# Patient Record
Sex: Female | Born: 1990 | Race: White | Hispanic: No | Marital: Single | Smoking: Former smoker
Health system: Southern US, Community
[De-identification: ages and names within clinical notes are randomized; demographics above are authoritative.]

## PROBLEM LIST (undated history)

## (undated) ENCOUNTER — Inpatient Hospital Stay: Payer: Self-pay

## (undated) DIAGNOSIS — D649 Anemia, unspecified: Secondary | ICD-10-CM

## (undated) HISTORY — PX: BACK SURGERY: SHX140

---
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
## 2009-06-05 ENCOUNTER — Emergency Department: Payer: Self-pay | Admitting: Unknown Physician Specialty

## 2009-06-26 ENCOUNTER — Emergency Department: Payer: Self-pay | Admitting: Emergency Medicine

## 2012-02-17 ENCOUNTER — Inpatient Hospital Stay: Payer: Self-pay | Admitting: Obstetrics and Gynecology

## 2012-02-17 LAB — PIH PROFILE
BUN: 8 mg/dL (ref 7–18)
Calcium, Total: 9 mg/dL (ref 8.5–10.1)
Co2: 25 mmol/L (ref 21–32)
EGFR (African American): >60
EGFR (Non-African Amer.): >60
Glucose: 84 mg/dL (ref 65–99)
MCV: 90 fL (ref 80–100)
Osmolality: 275 (ref 275–301)
Platelet: 263 10*3/uL (ref 150–440)
Potassium: 3.6 mmol/L (ref 3.5–5.1)
RBC: 3.41 10*6/uL — ABNORMAL LOW (ref 3.80–5.20)
RDW: 13.2 % (ref 11.5–14.5)
Sodium: 139 mmol/L (ref 136–145)
WBC: 12.5 10*3/uL — ABNORMAL HIGH (ref 3.6–11.0)

## 2012-02-17 LAB — PROTEIN / CREATININE RATIO, URINE
Creatinine, Urine: 122.8 mg/dL (ref 30.0–125.0)
Protein, Random Urine: 27 mg/dL — ABNORMAL HIGH (ref 0–12)
Protein/Creat. Ratio: 220 mg/gCREAT — ABNORMAL HIGH (ref 0–200)

## 2012-02-20 LAB — HEMATOCRIT: HCT: 28 % — ABNORMAL LOW (ref 35.0–47.0)

## 2012-02-26 ENCOUNTER — Emergency Department: Payer: Self-pay | Admitting: Internal Medicine

## 2013-12-14 ENCOUNTER — Emergency Department: Payer: Self-pay | Admitting: Emergency Medicine

## 2014-07-05 DIAGNOSIS — D649 Anemia, unspecified: Secondary | ICD-10-CM

## 2014-07-05 HISTORY — DX: Anemia, unspecified: D64.9

## 2014-11-12 NOTE — H&P (Signed)
L&D Evaluation:  History:   HPI 24yo G1 at 6717w4d [facility](EDC 02/13/12) referred from [facility] due to elevated BPs (130-140/90s) and 2+ proteinuria for r/o preE.  Pt denies any complaints, no HA, VC, SOB, edema.  +FM, no ctx, no VB. During L&D observation pt with 2min decel to 60s, with spontaneous return to nl baseline with position change.   AFI checked and found to be low at 4.0cm. PreE labs returned without significant abnormality.    Presents with other, r/o preE    Patient's Medical History Asthma  other  bipolar/anxiety    Patient's Surgical History other  L4-5 laminectomy    Medications Pre Natal Vitamins  prn albuterol    Allergies NKDA    Social History tobacco    Family History Non-Contributory   ROS:   ROS All systems were reviewed.  HEENT, CNS, GI, GU, Respiratory, CV, Renal and Musculoskeletal systems were found to be normal.   Exam:   Vital Signs stable  BPs wnl    Urine Protein trace    General no apparent distress    Mental Status clear    Abdomen gravid, non-tender    Estimated Fetal Weight Average for gestational age    Back no CVAT    Edema no edema    Pelvic cervix 1/80/-2    Mebranes Intact    FHT 140s mod variability, single decel to 60s x 2min, spontaneous return to baseline    Ucx absent    Other U/S as above - AFI 4cm, vertex presentation see PIH labs   Impression:   Impression other, oligohydramnios   Plan:   Plan monitor BP, Induction for oligohydramnios at term, pt amenable to treatment plan    Comments Cervidil for cervical ripening tonight, active labor management Blood type A NEG GBS neg   Electronic Signatures: Orvan FalconerStansbury Clipp, Charnelle Bergeman K (MD)  (Signed 15-Aug-13 17:00)  Authored: L&D Evaluation   Last Updated: 15-Aug-13 17:00 by Garnette GunnerStansbury Clipp, Ali LoweEryn K (MD)

## 2015-02-27 ENCOUNTER — Other Ambulatory Visit: Payer: Self-pay | Admitting: Advanced Practice Midwife

## 2015-02-27 DIAGNOSIS — Z369 Encounter for antenatal screening, unspecified: Secondary | ICD-10-CM

## 2015-02-27 LAB — OB RESULTS CONSOLE HIV ANTIBODY (ROUTINE TESTING): HIV: NONREACTIVE

## 2015-02-28 LAB — OB RESULTS CONSOLE GC/CHLAMYDIA
Chlamydia: NEGATIVE
GC PROBE AMP, GENITAL: NEGATIVE

## 2015-02-28 LAB — OB RESULTS CONSOLE RUBELLA ANTIBODY, IGM: RUBELLA: IMMUNE

## 2015-02-28 LAB — OB RESULTS CONSOLE ABO/RH: RH TYPE: NEGATIVE

## 2015-02-28 LAB — OB RESULTS CONSOLE HEPATITIS B SURFACE ANTIGEN: Hepatitis B Surface Ag: NEGATIVE

## 2015-02-28 LAB — OB RESULTS CONSOLE ANTIBODY SCREEN: Antibody Screen: NEGATIVE

## 2015-02-28 LAB — OB RESULTS CONSOLE RPR: RPR: NONREACTIVE

## 2015-02-28 LAB — OB RESULTS CONSOLE VARICELLA ZOSTER ANTIBODY, IGG: VARICELLA IGG: IMMUNE

## 2015-03-20 ENCOUNTER — Ambulatory Visit
Admission: RE | Admit: 2015-03-20 | Discharge: 2015-03-20 | Disposition: A | Discharge status: Home / Self Care | Payer: Medicaid Other | Source: Ambulatory Visit | Attending: Advanced Practice Midwife | Admitting: Advanced Practice Midwife

## 2015-03-20 ENCOUNTER — Ambulatory Visit (HOSPITAL_BASED_OUTPATIENT_CLINIC_OR_DEPARTMENT_OTHER)
Admission: RE | Admit: 2015-03-20 | Discharge: 2015-03-20 | Disposition: A | Discharge status: Home / Self Care | Payer: Medicaid Other | Source: Ambulatory Visit | Attending: Obstetrics & Gynecology | Admitting: Obstetrics & Gynecology

## 2015-03-20 DIAGNOSIS — Z369 Encounter for antenatal screening, unspecified: Secondary | ICD-10-CM

## 2015-03-20 DIAGNOSIS — F1290 Cannabis use, unspecified, uncomplicated: Secondary | ICD-10-CM | POA: Insufficient documentation

## 2015-03-20 DIAGNOSIS — Z36 Encounter for antenatal screening of mother: Secondary | ICD-10-CM | POA: Diagnosis not present

## 2015-03-20 DIAGNOSIS — O99322 Drug use complicating pregnancy, second trimester: Secondary | ICD-10-CM | POA: Insufficient documentation

## 2015-03-20 DIAGNOSIS — Z3402 Encounter for supervision of normal first pregnancy, second trimester: Secondary | ICD-10-CM

## 2015-03-20 DIAGNOSIS — Z3A14 14 weeks gestation of pregnancy: Secondary | ICD-10-CM | POA: Diagnosis not present

## 2015-03-20 DIAGNOSIS — O99332 Smoking (tobacco) complicating pregnancy, second trimester: Secondary | ICD-10-CM | POA: Insufficient documentation

## 2015-03-20 NOTE — Progress Notes (Addendum)
Referring physician:  [facility] Department Length of Consultation: 30 minutes   Ms. Katie Walton  was referred to [facility] for genetic counseling to review prenatal screening and testing options.  This note summarizes the information we discussed.    The following screening test options were reviewed: First trimester screening, which includes nuchal translucency ultrasound screen and first trimester maternal serum marker screening.  The nuchal translucency has approximately an 80% detection rate for Down syndrome and can be positive for other chromosome abnormalities as well as congenital heart defects.  When combined with a maternal serum marker screening, the detection rate is up to 90% for Down syndrome and up to 97% for trisomy 18.     Maternal serum marker screening, a blood test that measures pregnancy proteins, can provide risk assessments for Down syndrome, trisomy 18, and open neural tube defects (spina bifida, anencephaly). Because it does not directly examine the fetus, it cannot positively diagnose or rule out these problems.  Targeted ultrasound uses high frequency sound waves to create an image of the developing fetus.  An ultrasound is often recommended as a routine means of evaluating the pregnancy.  It is also used to screen for fetal anatomy problems (for example, a heart defect) that might be suggestive of a chromosomal or other abnormality.   Should these screening tests indicate an increased concern, then the following additional testing options would be offered:  The chorionic villus sampling procedure is available for first trimester chromosome analysis.  This involves the withdrawal of a small amount of chorionic villi (tissue from the developing placenta).  Risk of pregnancy loss is estimated to be approximately 1 in 200 to 1 in 100 (0.5 to 1%).  There is approximately a 1% (1 in 100) chance that the CVS chromosome results will be unclear.   Chorionic villi cannot be tested for neural tube defects.     Amniocentesis involves the removal of a small amount of amniotic fluid from the sac surrounding the fetus with the use of a thin needle inserted through the maternal abdomen and uterus.  Ultrasound guidance is used throughout the procedure.  Fetal cells from amniotic fluid are directly evaluated and > 99.5% of chromosome problems and > 98% of open neural tube defects can be detected. This procedure is generally performed after the 15th week of pregnancy.  The main risks to this procedure include complications leading to miscarriage in less than 1 in 200 cases (0.5%).  As another option for information if the pregnancy is suspected to be an an increased chance for certain chromosome conditions, we also reviewed the availability of cell free fetal DNA testing from maternal blood to determine whether or not the baby may have either Down syndrome, trisomy 63, or trisomy 55.  This test utilizes a maternal blood sample and DNA sequencing technology to isolate circulating cell free fetal DNA from maternal plasma.  The fetal DNA can then be analyzed for DNA sequences that are derived from the three most common chromosomes involved in aneuploidy, chromosomes 13, 18, and 21.  If the overall amount of DNA is greater than the expected level for any of these chromosomes, aneuploidy is suspected.  While we do not consider it a replacement for invasive testing and karyotype analysis, a negative result from this testing would be reassuring, though not a guarantee of a normal chromosome complement for the baby.  An abnormal result is certainly suggestive of an abnormal chromosome complement, though we would still  recommend CVS or amniocentesis to confirm any findings from this testing.  Cystic Fibrosis screening was also discussed with the patient. Cystic fibrosis (CF) is one of the most common genetic conditions in persons of Caucasian ancestry.  This condition  occurs in approximately 1 in 2,500 Caucasian persons and results in thickened secretions in the lungs, digestive, and reproductive systems.  For a baby to be at risk for having CF, both of the parents must be carriers for this condition.  Approximately 1 in 50 Caucasian persons is a carrier for CF.  Current carrier testing looks for the most common mutations in the gene for CF and can detect approximately 90% of carriers in the Caucasian population.  This means that the carrier screening can greatly reduce, but cannot eliminate, the chance for an individual to have a child with CF.  If an individual is found to be a carrier for CF, then carrier testing would be available for the partner. As part of Katie Walton, all babies born in the state of [place] will have a two-tier screening process.  Specimens are first tested to determine the concentration of immunoreactive trypsinogen (IRT).  The top 5% of specimens with the highest IRT values then undergo DNA testing using a panel of over 40 common CF mutations.   We obtained a detailed family history and pregnancy history.  In the family, the father of the baby reported that his maternal half brother and his partner had a stillborn baby and one with a heart condition requiring a pacemaker.  No specific details for these children were known.  We discussed that in order to provide an assessment, more medical information would be needed. We are happy to review medical records if they become available.  The remainder of the family history was reported to be unremarkable for birth defects, mental retardation, recurrent pregnancy loss or known chromosome abnormalities.  Katie Walton stated that this is her second pregnancy, the first with her current partner, Katie Walton.  She has a three year old son who is in good health.  She was induced in that pregnancy due to preeclampsia and was advised by her OB to begin taking baby aspirin starting at  [redacted] weeks gestation.  She reported smoking cigarettes and marijuana in this pregnancy.  We reviewed that both of these exposures are known to be associated with an increase in low birth weight, preterm delivery and poor pregnancy outcomes and therefore we encouraged her to stop for the remainder of the pregnancy.  After consideration of the options, Katie Walton elected to proceed with first trimester screening and declined CF carrier testing.  Following the ultrasound, the gestational age was too far along for this testing.  She was therefore told to speak with her OB about having maternal serum screening at 16-[redacted] weeks gestation.  The patient was scheduled to return in 3 weeks for an anatomy ultrasound.  An ultrasound was performed at the time of the visit.  The gestational age was consistent with  14 weeks, 6 days.  Fetal anatomy could not be assessed due to early gestational age.  Please refer to the ultrasound report for details of that study.  Katie Walton was encouraged to call with questions or concerns.  We can be contacted at [phone].   Cherly Anderson, MS, CGC  I was present for this consultation  Oreta Soloway, [place] A, MD

## 2015-04-09 ENCOUNTER — Other Ambulatory Visit: Payer: Self-pay

## 2015-04-09 DIAGNOSIS — O359XX0 Maternal care for (suspected) fetal abnormality and damage, unspecified, not applicable or unspecified: Secondary | ICD-10-CM

## 2015-04-10 ENCOUNTER — Ambulatory Visit
Admission: RE | Admit: 2015-04-10 | Discharge: 2015-04-10 | Disposition: A | Discharge status: Home / Self Care | Payer: No Typology Code available for payment source | Source: Ambulatory Visit | Attending: Maternal & Fetal Medicine | Admitting: Maternal & Fetal Medicine

## 2015-04-10 DIAGNOSIS — O359XX0 Maternal care for (suspected) fetal abnormality and damage, unspecified, not applicable or unspecified: Secondary | ICD-10-CM

## 2015-04-10 DIAGNOSIS — Z3A17 17 weeks gestation of pregnancy: Secondary | ICD-10-CM | POA: Diagnosis not present

## 2015-04-10 DIAGNOSIS — O99332 Smoking (tobacco) complicating pregnancy, second trimester: Secondary | ICD-10-CM | POA: Insufficient documentation

## 2015-04-10 MED ORDER — GLUCOSE BLOOD VI STRP: ORAL_STRIP | Status: DC

## 2015-04-10 MED ORDER — ONETOUCH ULTRASOFT LANCETS MISC: Status: DC

## 2015-04-24 ENCOUNTER — Ambulatory Visit
Admission: RE | Admit: 2015-04-24 | Discharge: 2015-04-24 | Disposition: A | Discharge status: Home / Self Care | Payer: No Typology Code available for payment source | Source: Ambulatory Visit | Attending: Maternal & Fetal Medicine | Admitting: Maternal & Fetal Medicine

## 2015-04-24 DIAGNOSIS — O359XX0 Maternal care for (suspected) fetal abnormality and damage, unspecified, not applicable or unspecified: Secondary | ICD-10-CM | POA: Diagnosis present

## 2015-04-24 DIAGNOSIS — Z3A19 19 weeks gestation of pregnancy: Secondary | ICD-10-CM | POA: Insufficient documentation

## 2015-06-08 ENCOUNTER — Observation Stay
Admission: EM | Admit: 2015-06-08 | Discharge: 2015-06-08 | Disposition: A | Discharge status: Home / Self Care | Payer: No Typology Code available for payment source | Attending: Obstetrics & Gynecology | Admitting: Obstetrics & Gynecology

## 2015-06-08 ENCOUNTER — Encounter: Payer: Self-pay | Admitting: *Deleted

## 2015-06-08 DIAGNOSIS — R1013 Epigastric pain: Secondary | ICD-10-CM | POA: Insufficient documentation

## 2015-06-08 DIAGNOSIS — O26892 Other specified pregnancy related conditions, second trimester: Principal | ICD-10-CM | POA: Insufficient documentation

## 2015-06-08 DIAGNOSIS — Z3A26 26 weeks gestation of pregnancy: Secondary | ICD-10-CM | POA: Diagnosis not present

## 2015-06-08 DIAGNOSIS — R109 Unspecified abdominal pain: Secondary | ICD-10-CM | POA: Diagnosis present

## 2015-06-08 MED ORDER — ACETAMINOPHEN 325 MG PO TABS: 650.0000 mg | ORAL_TABLET | ORAL | Status: DC | PRN

## 2015-06-08 NOTE — Discharge Summary (Signed)
Valora PiccoloHannah H Walton is a 24 y.o. female. She is at 9823w2d gestation.  Indication: abdominal/epigastric pain  S: Resting comfortably. no CTX, no VB. Active fetal movement. Concerned about epigastric pain and abdominal pain.  Upon arrival to triage these pains have subsided.  No complaints at this time.  O:  Temp(Src) 98.4 F (36.9 C) (Oral)  Resp 16  Ht 5\' 4"  (1.626 m)  Wt 72.576 kg (160 lb)  BMI 27.45 kg/m2  LMP 12/22/2014    Gen: NAD, AAOx3      Abd: FNTTP      Ext: Non-tender, Nonedmeatous    FHT: 155 moderate variability. TOCO: quiet SVE: deferred   A/P:  24yo @ 26.2 with previous history of epigastric/abdominal pain requesting to be discharged.   Labor: not present.   Pain resolved.   Fetal Wellbeing: FHT baseline WNL.  D/c home stable, precautions reviewed, follow-up as scheduled.   Ward, Elenora Fenderhelsea C

## 2015-06-08 NOTE — OB Triage Note (Signed)
Abdominal pain since 1400. Mid to lower abdominal pain per patient. No pain as of now. When she has the pain it is a constant stabbing pain. Elaina HoopsElks, Eligio Angert S

## 2015-06-19 LAB — OB RESULTS CONSOLE HIV ANTIBODY (ROUTINE TESTING): HIV: NONREACTIVE

## 2015-06-20 LAB — OB RESULTS CONSOLE RPR: RPR: NONREACTIVE

## 2015-07-03 ENCOUNTER — Ambulatory Visit
Admission: RE | Admit: 2015-07-03 | Discharge: 2015-07-03 | Disposition: A | Discharge status: Home / Self Care | Payer: Managed Care, Other (non HMO) | Source: Ambulatory Visit | Attending: Obstetrics and Gynecology | Admitting: Obstetrics and Gynecology

## 2015-07-03 DIAGNOSIS — O359XX0 Maternal care for (suspected) fetal abnormality and damage, unspecified, not applicable or unspecified: Secondary | ICD-10-CM

## 2015-07-03 HISTORY — DX: Anemia, unspecified: D64.9

## 2015-07-06 NOTE — L&D Delivery Note (Signed)
Deliver Note   Date of Delivery:   09/14/2015 Primary OB:   [facility] Gestational Age/EDD: 281w2d by 09/12/2015, by Other Basis  Antepartum complications:  OB History    Gravida Para Term Preterm AB TAB SAB Ectopic Multiple Living   2 1 1  0 0 0 0 0 0 1      Delivered By:   [name], [name] MD  Delivery Type:   TSVD Anesthesia:     none  Intrapartum complications:  GBS:    Negative (02/17 0000) Laceration:     none Episiotomy:    none Placenta:    Spontaneous Estimated Blood Loss:   250mL Baby:     Liveborn female  APGAR (1 MIN): 8  APGAR (5 MINS): 9  Weight  pending   Deliver Details   At 22:41 a liveborn female was delivered via TSVD (Presentation: OA).  APGAR: 8, 9; weight  pending.   Placenta status: spontaneous, intact.  Cord: 3VC with the following complications: meconium, THC positive urine drug screen.    Mom to postpartum.  Baby to Couplet care / Skin to Skin.

## 2015-08-21 LAB — OB RESULTS CONSOLE GC/CHLAMYDIA
Chlamydia: NEGATIVE
GC PROBE AMP, GENITAL: NEGATIVE

## 2015-08-22 LAB — OB RESULTS CONSOLE GBS: STREP GROUP B AG: NEGATIVE

## 2015-09-14 ENCOUNTER — Inpatient Hospital Stay
Admission: EM | Admit: 2015-09-14 | Discharge: 2015-09-16 | DRG: 775 | Disposition: A | Discharge status: Home / Self Care | Payer: Medicaid Other | Attending: Obstetrics and Gynecology | Admitting: Obstetrics and Gynecology

## 2015-09-14 DIAGNOSIS — O99324 Drug use complicating childbirth: Secondary | ICD-10-CM | POA: Diagnosis present

## 2015-09-14 DIAGNOSIS — Z23 Encounter for immunization: Secondary | ICD-10-CM

## 2015-09-14 DIAGNOSIS — F17200 Nicotine dependence, unspecified, uncomplicated: Secondary | ICD-10-CM | POA: Diagnosis present

## 2015-09-14 DIAGNOSIS — F319 Bipolar disorder, unspecified: Secondary | ICD-10-CM | POA: Diagnosis present

## 2015-09-14 DIAGNOSIS — Z3A40 40 weeks gestation of pregnancy: Secondary | ICD-10-CM

## 2015-09-14 DIAGNOSIS — F1290 Cannabis use, unspecified, uncomplicated: Secondary | ICD-10-CM | POA: Diagnosis present

## 2015-09-14 DIAGNOSIS — O99334 Smoking (tobacco) complicating childbirth: Secondary | ICD-10-CM | POA: Diagnosis present

## 2015-09-14 LAB — PROTEIN / CREATININE RATIO, URINE
Creatinine, Urine: 235 mg/dL
PROTEIN CREATININE RATIO: 0.26 mg/mg{creat} — AB (ref 0.00–0.15)
Total Protein, Urine: 62 mg/dL

## 2015-09-14 LAB — CBC
HEMATOCRIT: 33.6 % — AB (ref 35.0–47.0)
Hemoglobin: 11.1 g/dL — ABNORMAL LOW (ref 12.0–16.0)
MCH: 30.3 pg (ref 26.0–34.0)
MCHC: 33 g/dL (ref 32.0–36.0)
MCV: 91.8 fL (ref 80.0–100.0)
Platelets: 325 10*3/uL (ref 150–440)
RBC: 3.66 MIL/uL — AB (ref 3.80–5.20)
RDW: 13.2 % (ref 11.5–14.5)
WBC: 20.7 10*3/uL — AB (ref 3.6–11.0)

## 2015-09-14 LAB — COMPREHENSIVE METABOLIC PANEL
ALBUMIN: 3.2 g/dL — AB (ref 3.5–5.0)
ALK PHOS: 138 U/L — AB (ref 38–126)
ALT: 10 U/L — AB (ref 14–54)
ANION GAP: 8 (ref 5–15)
AST: 19 U/L (ref 15–41)
BUN: 9 mg/dL (ref 6–20)
CALCIUM: 8.4 mg/dL — AB (ref 8.9–10.3)
CO2: 20 mmol/L — AB (ref 22–32)
Chloride: 105 mmol/L (ref 101–111)
Creatinine, Ser: 0.36 mg/dL — ABNORMAL LOW (ref 0.44–1.00)
GFR calc Af Amer: >60 mL/min (ref >60)
GFR calc non Af Amer: >60 mL/min (ref >60)
GLUCOSE: 91 mg/dL (ref 65–99)
Potassium: 3.7 mmol/L (ref 3.5–5.1)
SODIUM: 133 mmol/L — AB (ref 135–145)
Total Bilirubin: 0.5 mg/dL (ref 0.3–1.2)
Total Protein: 7.1 g/dL (ref 6.5–8.1)

## 2015-09-14 LAB — URINE DRUG SCREEN, QUALITATIVE (ARMC ONLY)
AMPHETAMINES, UR SCREEN: NOT DETECTED
Barbiturates, Ur Screen: NOT DETECTED
Benzodiazepine, Ur Scrn: NOT DETECTED
Cannabinoid 50 Ng, Ur ~~LOC~~: POSITIVE — AB
Cocaine Metabolite,Ur ~~LOC~~: NOT DETECTED
MDMA (ECSTASY) UR SCREEN: NOT DETECTED
Methadone Scn, Ur: NOT DETECTED
Opiate, Ur Screen: NOT DETECTED
PHENCYCLIDINE (PCP) UR S: NOT DETECTED
Tricyclic, Ur Screen: NOT DETECTED

## 2015-09-14 LAB — ABO/RH: ABO/RH(D): A NEG

## 2015-09-14 MED ORDER — CITRIC ACID-SODIUM CITRATE 334-500 MG/5ML PO SOLN: 30.0000 mL | ORAL | Status: DC | PRN

## 2015-09-14 MED ORDER — OXYTOCIN 40 UNITS IN LACTATED RINGERS INFUSION - SIMPLE MED
2.5000 [IU]/h | INTRAVENOUS | Status: DC
  Administered 2015-09-14: 40 [IU] via INTRAVENOUS

## 2015-09-14 MED ORDER — LACTATED RINGERS IV SOLN: INTRAVENOUS | Status: DC

## 2015-09-14 MED ORDER — LIDOCAINE HCL (PF) 1 % IJ SOLN
INTRAMUSCULAR | Status: AC
  Filled 2015-09-14: qty 30

## 2015-09-14 MED ORDER — ACETAMINOPHEN 325 MG PO TABS: 650.0000 mg | ORAL_TABLET | ORAL | Status: DC | PRN

## 2015-09-14 MED ORDER — OXYTOCIN BOLUS FROM INFUSION: 500.0000 mL | INTRAVENOUS | Status: DC

## 2015-09-14 MED ORDER — ONDANSETRON HCL 4 MG/2ML IJ SOLN: 4.0000 mg | Freq: Four times a day (QID) | INTRAMUSCULAR | Status: DC | PRN

## 2015-09-14 MED ORDER — OXYTOCIN 10 UNIT/ML IJ SOLN
INTRAMUSCULAR | Status: AC
  Filled 2015-09-14: qty 2

## 2015-09-14 MED ORDER — AMMONIA AROMATIC IN INHA
RESPIRATORY_TRACT | Status: AC
  Filled 2015-09-14: qty 10

## 2015-09-14 MED ORDER — MISOPROSTOL 200 MCG PO TABS
ORAL_TABLET | ORAL | Status: AC
  Filled 2015-09-14: qty 4

## 2015-09-14 MED ORDER — BUTORPHANOL TARTRATE 1 MG/ML IJ SOLN: 1.0000 mg | INTRAMUSCULAR | Status: DC | PRN

## 2015-09-14 MED ORDER — LACTATED RINGERS IV SOLN: 500.0000 mL | INTRAVENOUS | Status: DC | PRN

## 2015-09-14 MED ORDER — LIDOCAINE HCL (PF) 1 % IJ SOLN: 30.0000 mL | INTRAMUSCULAR | Status: DC | PRN

## 2015-09-14 MED ORDER — OXYTOCIN 40 UNITS IN LACTATED RINGERS INFUSION - SIMPLE MED
INTRAVENOUS | Status: AC
  Administered 2015-09-14: 40 [IU] via INTRAVENOUS
  Filled 2015-09-14: qty 1000

## 2015-09-14 NOTE — Discharge Summary (Signed)
Obstetric Discharge Summary Reason for Admission: onset of labor 09/14/2015 Prenatal Procedures: none Intrapartum Procedures: spontaneous vaginal delivery 09/14/2015 Postpartum Procedures: none Complications-Operative and Postpartum: none HEMOGLOBIN  Date Value Ref Range Status  09/15/2015 11.2* 12.0 - 16.0 g/dL Final   HGB  Date Value Ref Range Status  02/17/2012 10.2* 12.0-16.0 g/dL Final   HCT  Date Value Ref Range Status  09/15/2015 33.0* 35.0 - 47.0 % Final  02/20/2012 28.0* 35.0-47.0 % Final    Physical Exam:  General: alert, appears stated age and no distress Lochia: appropriate Uterine Fundus: firm at U/ML/NT DVT Evaluation: No evidence of DVT seen on physical exam.  Discharge Diagnoses: Term Pregnancy-delivered  Marijuana use in pregnancy  Discharge Information: Date: 09/16/2015 Activity: pelvic rest x 6 weeks Diet: routine   Medication List    STOP taking these medications        ferrous sulfate 325 (65 FE) MG tablet      TAKE these medications        acetaminophen 500 MG tablet  Commonly known as:  TYLENOL  Take 500 mg by mouth every 6 (six) hours as needed for mild pain.     albuterol 108 (90 Base) MCG/ACT inhaler  Commonly known as:  PROVENTIL HFA;VENTOLIN HFA  Inhale 2 puffs into the lungs every 4 (four) hours as needed for wheezing or shortness of breath.     ibuprofen 600 MG tablet  Commonly known as:  ADVIL,MOTRIN  Take 1 tablet (600 mg total) by mouth every 6 (six) hours as needed for mild pain, moderate pain or cramping.     prenatal multivitamin Tabs tablet  Take 1 tablet by mouth daily at 12 noon.        Condition: stable Discharge to: home Follow-up Information    [facility] Call in 1 day.   Why:  for 6 week postpartum appt   Contact information:   [address] [place] [id number] [phone]       Newborn Data: Live born unspecified sex  Birth Weight: 6 lb 3  oz (2807 g)/ Oliver/ Breast APGAR: 8,9  Home with mother.  GUTIERREZ, COLLEEN 09/16/2015, 9:10 AM   *Edited for correction of delivery date. Thomasene MohairStephen Devarion Mcclanahan, MD 09/16/2015 9:51 AM

## 2015-09-14 NOTE — Progress Notes (Signed)
Subjective:  No concerns, tolerating contractions well  Objective:   Vitals: Blood pressure 129/88, pulse 88, temperature 98.4 F (36.9 C), temperature source Oral, resp. rate 18, height 5\' 3"  (1.6 m), weight 80.74 kg (178 lb), last menstrual period 12/22/2014. General:  Abdomen: Cervical Exam: 7.5/C/-2, arom meconium  FHT: 150, moderate variability, +accels, two decels 18:24 and 20:36 Toco: q2-674min  Results for orders placed or performed during the hospital encounter of 09/14/15 (from the past 24 hour(s))  Protein / creatinine ratio, urine     Status: Abnormal   Collection Time: 09/14/15  6:54 PM  Result Value Ref Range   Creatinine, Urine 235 mg/dL   Total Protein, Urine 62 mg/dL   Protein Creatinine Ratio 0.26 (H) 0.00 - 0.15 mg/mg[Cre]  Urine Drug Screen, Qualitative ([facility] only)     Status: Abnormal   Collection Time: 09/14/15  6:54 PM  Result Value Ref Range   Tricyclic, Ur Screen NONE DETECTED NONE DETECTED   Amphetamines, Ur Screen NONE DETECTED NONE DETECTED   MDMA (Ecstasy)Ur Screen NONE DETECTED NONE DETECTED   Cocaine Metabolite,Ur [place] NONE DETECTED NONE DETECTED   Opiate, Ur Screen NONE DETECTED NONE DETECTED   Phencyclidine (PCP) Ur S NONE DETECTED NONE DETECTED   Cannabinoid 50 Ng, Ur [place] POSITIVE (A) NONE DETECTED   Barbiturates, Ur Screen NONE DETECTED NONE DETECTED   Benzodiazepine, Ur Scrn NONE DETECTED NONE DETECTED   Methadone Scn, Ur NONE DETECTED NONE DETECTED  Type and screen [facility]     Status: None   Collection Time: 09/14/15  6:55 PM  Result Value Ref Range   ABO/RH(D) A NEG    Antibody Screen NEG    Sample Expiration 09/17/2015   Comprehensive metabolic panel     Status: Abnormal   Collection Time: 09/14/15  6:57 PM  Result Value Ref Range   Sodium 133 (L) 135 - 145 mmol/L   Potassium 3.7 3.5 - 5.1 mmol/L   Chloride 105 101 - 111 mmol/L   CO2 20 (L) 22 - 32 mmol/L   Glucose, Bld 91 65 - 99 mg/dL   BUN 9 6 - 20 mg/dL   Creatinine, Ser 0.980.36 (L) 0.44 - 1.00 mg/dL   Calcium 8.4 (L) 8.9 - 10.3 mg/dL   Total Protein 7.1 6.5 - 8.1 g/dL   Albumin 3.2 (L) 3.5 - 5.0 g/dL   AST 19 15 - 41 U/L   ALT 10 (L) 14 - 54 U/L   Alkaline Phosphatase 138 (H) 38 - 126 U/L   Total Bilirubin 0.5 0.3 - 1.2 mg/dL   GFR calc non Af Amer >60 >60 mL/min   GFR calc Af Amer >60 >60 mL/min   Anion gap 8 5 - 15  CBC     Status: Abnormal   Collection Time: 09/14/15  6:57 PM  Result Value Ref Range   WBC 20.7 (H) 3.6 - 11.0 K/uL   RBC 3.66 (L) 3.80 - 5.20 MIL/uL   Hemoglobin 11.1 (L) 12.0 - 16.0 g/dL   HCT 11.933.6 (L) 14.735.0 - 82.947.0 %   MCV 91.8 80.0 - 100.0 fL   MCH 30.3 26.0 - 34.0 pg   MCHC 33.0 32.0 - 36.0 g/dL   RDW 56.213.2 13.011.5 - 86.514.5 %   Platelets 325 150 - 440 K/uL    Assessment:   25 y.o. G2P1001 5940w2d term labor  Plan:   1) Labor - arom performed with meconium stained fluid noted, external monitors  2) Fetus - cat II tracing,  overall category I with exception of 2 decels noted above

## 2015-09-14 NOTE — H&P (Signed)
Obstetric H&P   Chief Complaint: Contractions  Prenatal Care Provider: [facility]  History of Present Illness: 25 y.o. G2P1001 9w2dby 09/12/2015 by 10 week [place] presenting with contractions since 0400 this morning.  No LOF, no VB, +FM.  PNC notable for THC+ UDS, history of bipolar currently unmedicated but previously on lamictal, history of self harming behavior, history of asthma.  History of preclampsia with prior birth 2013.  Smoker  A negative / RI by history MMR x 2 / VZI / HBsAg neg / RPR NR  / HIV neg / Quad screen negative / Glucose 123 / GBS negative 08/22/15  TDAP 06/19/15, declined influenza vaccination   Breast feeding/Paraguard  Review of Systems: 10 point review of systems negative unless otherwise noted in HPI  Past Medical History: Past Medical History  Diagnosis Date  . Pregnancy induced hypertension 2013  . Anemia 2016  . Anxiety     h/o anxiety  . Depression     h/o bipolar    Past Surgical History: Past Surgical History  Procedure Laterality Date  . Back surgery  25years old   Family History: History reviewed. No pertinent family history.  Social History: Social History   Social History  . Marital Status: Single    Spouse Name: N/A  . Number of Children: N/A  . Years of Education: N/A   Occupational History  . Not on file.   Social History Main Topics  . Smoking status: Current Every Day Smoker -- 0.50 packs/day  . Smokeless tobacco: Never Used  . Alcohol Use: No  . Drug Use: No  . Sexual Activity: Yes   Other Topics Concern  . Not on file   Social History Narrative    Medications: Prior to Admission medications   Medication Sig Start Date End Date Taking? Authorizing Provider  acetaminophen (TYLENOL) 500 MG tablet Take 500 mg by mouth every 6 (six) hours as needed for mild pain.   Yes Historical Provider, MD  albuterol (PROVENTIL HFA;VENTOLIN HFA) 108 (90 BASE) MCG/ACT inhaler Inhale 2 puffs into the lungs every 4 (four) hours as needed  for wheezing or shortness of breath.   Yes Historical Provider, MD  ferrous sulfate 325 (65 FE) MG tablet Take 325 mg by mouth daily with breakfast.   Yes Historical Provider, MD  Prenatal Vit-Fe Fumarate-FA (PRENATAL MULTIVITAMIN) TABS tablet Take 1 tablet by mouth daily at 12 noon.   Yes Historical Provider, MD    Allergies: No Known Allergies  Physical Exam: Vitals: Blood pressure 134/81, pulse 90, temperature 98.4 F (36.9 C), temperature source Oral, resp. rate 18, height 5' 3"  (1.6 m), weight 80.74 kg (178 lb), last menstrual period 12/22/2014.  Urine Dip Protein: P/C ratio pending  FHT: 150, moderate, +accels, one deceleration unrelated to contraction  Toco: q2-466m  General: NAD HEENT: normocephalic, anicteric Pulmonary: no increased work of breathing Abdomen: Gravid,  Non-tender Leopolds: vtx Genitourinary: 7cm per nursing staff Extremities: no edema  Labs: No results found for this or any previous visit (from the past 24 hour(s)).  Assessment: [age].o. G2P1001 4025w2d 09/12/2015, presenting in term labor  Plan: 1) Labor - expectant management  2) Fetus - cat I tracing  3) A negative / RI by history MMR x 2 / VZI / HBsAg neg / RPR NR  / HIV neg / Quad screen negative / Glucose 123 / GBS negative 08/22/15  4) TDAP - received  5) Disposition - pending delivery

## 2015-09-15 ENCOUNTER — Encounter: Payer: Self-pay | Admitting: *Deleted

## 2015-09-15 LAB — CBC
HEMATOCRIT: 33 % — AB (ref 35.0–47.0)
Hemoglobin: 11.2 g/dL — ABNORMAL LOW (ref 12.0–16.0)
MCH: 30.5 pg (ref 26.0–34.0)
MCHC: 33.8 g/dL (ref 32.0–36.0)
MCV: 90.2 fL (ref 80.0–100.0)
Platelets: 301 10*3/uL (ref 150–440)
RBC: 3.66 MIL/uL — ABNORMAL LOW (ref 3.80–5.20)
RDW: 12.9 % (ref 11.5–14.5)
WBC: 24.6 10*3/uL — ABNORMAL HIGH (ref 3.6–11.0)

## 2015-09-15 LAB — FETAL SCREEN: FETAL SCREEN: NEGATIVE

## 2015-09-15 LAB — TYPE AND SCREEN
ABO/RH(D): A NEG
Antibody Screen: NEGATIVE

## 2015-09-15 MED ORDER — LANOLIN HYDROUS EX OINT: TOPICAL_OINTMENT | CUTANEOUS | Status: DC | PRN

## 2015-09-15 MED ORDER — SENNOSIDES-DOCUSATE SODIUM 8.6-50 MG PO TABS
2.0000 | ORAL_TABLET | ORAL | Status: DC
  Administered 2015-09-15: 2 via ORAL
  Filled 2015-09-15: qty 2

## 2015-09-15 MED ORDER — OXYCODONE-ACETAMINOPHEN 5-325 MG PO TABS: 1.0000 | ORAL_TABLET | ORAL | Status: DC | PRN

## 2015-09-15 MED ORDER — DIBUCAINE 1 % RE OINT: 1.0000 "application " | TOPICAL_OINTMENT | RECTAL | Status: DC | PRN

## 2015-09-15 MED ORDER — ACETAMINOPHEN 325 MG PO TABS: 650.0000 mg | ORAL_TABLET | ORAL | Status: DC | PRN

## 2015-09-15 MED ORDER — ONDANSETRON HCL 4 MG PO TABS: 4.0000 mg | ORAL_TABLET | ORAL | Status: DC | PRN

## 2015-09-15 MED ORDER — PRENATAL MULTIVITAMIN CH
1.0000 | ORAL_TABLET | Freq: Every day | ORAL | Status: DC
  Administered 2015-09-15 – 2015-09-16 (×2): 1 via ORAL
  Filled 2015-09-15 (×2): qty 1

## 2015-09-15 MED ORDER — OXYCODONE-ACETAMINOPHEN 5-325 MG PO TABS
2.0000 | ORAL_TABLET | ORAL | Status: DC | PRN
  Administered 2015-09-15: 2 via ORAL
  Filled 2015-09-15: qty 2

## 2015-09-15 MED ORDER — SIMETHICONE 80 MG PO CHEW: 80.0000 mg | CHEWABLE_TABLET | ORAL | Status: DC | PRN

## 2015-09-15 MED ORDER — DIPHENHYDRAMINE HCL 25 MG PO CAPS: 25.0000 mg | ORAL_CAPSULE | Freq: Four times a day (QID) | ORAL | Status: DC | PRN

## 2015-09-15 MED ORDER — ONDANSETRON HCL 4 MG/2ML IJ SOLN: 4.0000 mg | INTRAMUSCULAR | Status: DC | PRN

## 2015-09-15 MED ORDER — RHO D IMMUNE GLOBULIN 1500 UNIT/2ML IJ SOSY
300.0000 ug | PREFILLED_SYRINGE | Freq: Once | INTRAMUSCULAR | Status: AC
  Administered 2015-09-15: 300 ug via INTRAMUSCULAR
  Filled 2015-09-15: qty 2

## 2015-09-15 MED ORDER — IBUPROFEN 600 MG PO TABS
600.0000 mg | ORAL_TABLET | Freq: Four times a day (QID) | ORAL | Status: DC
  Administered 2015-09-15 – 2015-09-16 (×6): 600 mg via ORAL
  Filled 2015-09-15 (×6): qty 1

## 2015-09-15 MED ORDER — BENZOCAINE-MENTHOL 20-0.5 % EX AERO: 1.0000 "application " | INHALATION_SPRAY | CUTANEOUS | Status: DC | PRN

## 2015-09-15 MED ORDER — IBUPROFEN 600 MG PO TABS
ORAL_TABLET | ORAL | Status: AC
Start: 2015-09-15 — End: 2015-09-15
  Administered 2015-09-15: 600 mg via ORAL
  Filled 2015-09-15: qty 1

## 2015-09-15 MED ORDER — WITCH HAZEL-GLYCERIN EX PADS: 1.0000 "application " | MEDICATED_PAD | CUTANEOUS | Status: DC | PRN

## 2015-09-15 NOTE — Progress Notes (Signed)
Post Partum Day 1 Subjective: Doing well, no complaints.  Tolerating regular diet, pain with PO meds, voiding and ambulating without difficulty.  No CP SOB F/C N/V or leg pain No HA change of vision, RUQ/epigastric pain  Objective: BP 122/76 mmHg  Pulse 83  Temp(Src) 97.9 F (36.6 C) (Oral)  Resp 18  Ht 5\' 3"  (1.6 m)  Wt 80.74 kg (178 lb)  BMI 31.54 kg/m2  SpO2 96%  LMP 12/22/2014  Breastfeeding  Physical Exam:  General: NAD CV: RRR Pulm: nl effort, CTABL Lochia: moderate Uterine Fundus: fundus firm and below umbilicus DVT Evaluation: no cords, ttp LEs    Recent Labs  09/14/15 1857 09/15/15 0453  HGB 11.1* 11.2*  HCT 33.6* 33.0*  WBC 20.7* 24.6*  PLT 325 301    Assessment/Plan: 25 y.o. G2P2001 postpartum day # 1  1. Continue routine postpartum care  2. Breastfeeding- has had discussion concerning breastfeeding with + marijuana UDS and refuses formula  3. Will need Rhogam before discharge- baby is A positive  4. A negative, RI/VI, TDAP UTD  5. Contraception: plans Paraguard  6. Disposition: home tomorrow    Tresea MallGLEDHILL,Alexandros Ewan, CNM   This patient and plan were discussed with Dr Tiburcio PeaHarris 09/15/2015

## 2015-09-15 NOTE — Progress Notes (Signed)
Mom instructed in her use of Mariajuana being transferred to Infant via her Breast Milk. Mom also instructed in the potential risk to Infant's Health and overall Growth and Development because of the presence of Marajuana in her Breast Milk. Mom V/O and refuses Formula to feed Infant.

## 2015-09-15 NOTE — Lactation Note (Signed)
This note was copied from a baby's chart. Lactation Consultation Note  Patient Name: Boy Adria DevonHannah Morriss ZOXWR'UToday's Date: 09/15/2015 Reason for consult: Follow-up assessment (sleepy baby, need hlep with latch)   Maternal Data  MOm   [facility] counseled by Dr. Shanon RosserPage that she would need to pump and dump breastmilk until she has neg. Drug screen, mom declined to do this and does not want baby to have formula.     Feeding Feeding Type: Breast Milk  LATCH Score/Interventions Latch: Repeated attempts needed to sustain latch, nipple held in mouth throughout feeding, stimulation needed to elicit sucking reflex. Intervention(s): Assist with latch (gaggy, swallowing, not opening mouth well)  Audible Swallowing: Spontaneous and intermittent  Type of Nipple: Everted at rest and after stimulation  Comfort (Breast/Nipple): Soft / non-tender     Hold (Positioning): Assistance needed to correctly position infant at breast and maintain latch. Intervention(s): Skin to skin  LATCH Score: 8  Lactation Tools Discussed/Used     Consult Status Consult Status: PRN    Dyann KiefMarsha D Kingston Guiles 09/15/2015, 7:30 PM

## 2015-09-16 LAB — RHOGAM INJECTION: Unit division: 0

## 2015-09-16 LAB — RPR: RPR: NONREACTIVE

## 2015-09-16 MED ORDER — IBUPROFEN 600 MG PO TABS: 600.0000 mg | ORAL_TABLET | Freq: Four times a day (QID) | ORAL | Status: DC | PRN

## 2015-09-16 NOTE — Clinical Social Work Note (Signed)
The following is the full CSW assessment placed on patient's newborn's chart: [facility] MATERNAL/CHILD NOTE  Patient Details  Name: Katie Walton MRN: [id number] Date of Birth: 09/14/2016  Date: 09/16/2015  Clinical Social Worker Initiating Note: Shela Leff MSW,LCSWDate/ Time Initiated: 09/09/15/1100   Child's Name:     Legal Guardian: Mother   Need for Interpreter: None   Date of Referral: 09/16/15   Reason for Referral: Current Substance Use/Substance Use During Pregnancy    Referral Source: Physician   Address:    Phone number:     Household Members: Self, Minor Children   Natural Supports (not living in the home): Immediate Family   Professional Supports:    Employment:Full-time   Type of Work:     Education: Database administrator Resources:Medicaid   Other Resources:     Cultural/Religious Considerations Which May Impact Care: none  Strengths: Ability to meet basic needs , Compliance with medical plan , Home prepared for child    Risk Factors/Current Problems: Substance Use    Cognitive State: Alert , Able to Concentrate    Mood/Affect: Sad , Tearful , Calm    CSW Assessment:CSW asked to see patient's mother due to patient testing positive for marijuana. CSW met with patient's mother and father this morning. Patient's mother had just gotten off of the phone with the pediatrician and had been informed that patient was not going to discharge as expected and would need to be placed under bili lights. Patient's mother was tearful at this information and father of patient was consoling her. CSW informed by patient's mother that this is her second child and that her other child is 3 and stays with her mother during the week and then stays with her on the weekends. Patient's mother did not elaborate on why this arrangement was made but that it had been this way since her son was born.  Patient's mother denies having DSS involvement or police involvement in the past. Patient's mother states that she has been diagnosed with bipolar and that she uses marijuana to calm her. She stated that she was placed on medication at one point but did not like how it made her feel and she opted to stop taking it. CSW encouraged patient's mother to seek professional counseling/assistance with medication recommendations for her bipolar. Patient's mother not interested in any assistance at this time. Patient's mother stated she has been able to "manage" her bipolar for the past 2 years fairly well.   CSW explained that due to patient testing positive for marijuana, that a DSS CPS report would need to be made. Patient's mother and father verbalized understanding but patient's father stated "if we were in [place], this wouldn't be happening." CSW explained the reporting process and DSS follow up. DSS CPS report to be made today to [facility].   CSW Plan/Description: Child Protective Service Report

## 2015-09-16 NOTE — Discharge Instructions (Signed)
Vaginal Delivery, Care After Refer to this sheet in the next few weeks. These discharge instructions provide you with information on caring for yourself after delivery. Your caregiver may also give you specific instructions. Your treatment has been planned according to the most current medical practices available, but problems sometimes occur. Call your caregiver if you have any problems or questions after you go home. HOME CARE INSTRUCTIONS 1. Take over-the-counter or prescription medicines only as directed by your caregiver or pharmacist. 2. Do not drink alcohol, especially if you are breastfeeding or taking medicine to relieve pain. 3. Do not smoke tobacco. 4. Continue to use good perineal care. Good perineal care includes: 1. Wiping your perineum from back to front 2. Keeping your perineum clean. 3. You can do sitz baths twice a day, to help keep this area clean 5. Do not use tampons, douche or have sex until your caregiver says it is okay. 6. Shower only and avoid sitting in submerged water, aside from sitz baths 7. Wear a well-fitting bra that provides breast support. 8. Eat healthy foods. 9. Drink enough fluids to keep your urine clear or pale yellow. 10. Eat high-fiber foods such as whole grain cereals and breads, brown rice, beans, and fresh fruits and vegetables every day. These foods may help prevent or relieve constipation. 11. Avoid constipation with high fiber foods or medications, such as miralax or metamucil 12. Follow your caregiver's recommendations regarding resumption of activities such as climbing stairs, driving, lifting, exercising, or traveling. 13. Talk to your caregiver about resuming sexual activities. Resumption of sexual activities is dependent upon your risk of infection, your rate of healing, and your comfort and desire to resume sexual activity. 14. Try to have someone help you with your household activities and your newborn for at least a few days after you leave  the hospital. 15. Rest as much as possible. Try to rest or take a nap when your newborn is sleeping. 16. Increase your activities gradually. 17. Keep all of your scheduled postpartum appointments. It is very important to keep your scheduled follow-up appointments. At these appointments, your caregiver will be checking to make sure that you are healing physically and emotionally. SEEK MEDICAL CARE IF:   You are passing large clots from your vagina. Save any clots to show your caregiver.  You have a foul smelling discharge from your vagina.  You have trouble urinating.  You are urinating frequently.  You have pain when you urinate.  You have a change in your bowel movements.  You have increasing redness, pain, or swelling near your vaginal incision (episiotomy) or vaginal tear.  You have pus draining from your episiotomy or vaginal tear.  Your episiotomy or vaginal tear is separating.  You have painful, hard, or reddened breasts.  You have a severe headache.  You have blurred vision or see spots.  You feel sad or depressed.  You have thoughts of hurting yourself or your newborn.  You have questions about your care, the care of your newborn, or medicines.  You are dizzy or light-headed.  You have a rash.  You have nausea or vomiting.  You were breastfeeding and have not had a menstrual period within 12 weeks after you stopped breastfeeding.  You are not breastfeeding and have not had a menstrual period by the 12th week after delivery.  You have a fever. SEEK IMMEDIATE MEDICAL CARE IF:   You have persistent pain.  You have chest pain.  You have shortness of breath.    You faint.  You have leg pain.  You have stomach pain.  Your vaginal bleeding saturates two or more sanitary pads in 1 hour. MAKE SURE YOU:   Understand these instructions.  Will watch your condition.  Will get help right away if you are not doing well or get worse. Document Released:  06/18/2000 Document Revised: 11/05/2013 Document Reviewed: 02/16/2012 ExitCare Patient Information 2015 [employer]. This information is not intended to replace advice given to you by your health care provider. Make sure you discuss any questions you have with your health care provider.  Sitz Bath A sitz bath is a warm water bath taken in the sitting position. The water covers only the hips and butt (buttocks). We recommend using one that fits in the toilet, to help with ease of use and cleanliness. It may be used for either healing or cleaning purposes. Sitz baths are also used to relieve pain, itching, or muscle tightening (spasms). The water may contain medicine. Moist heat will help you heal and relax.  HOME CARE  Take 3 to 4 sitz baths a day. 18. Fill the bathtub half-full with warm water. 19. Sit in the water and open the drain a little. 20. Turn on the warm water to keep the tub half-full. Keep the water running constantly. 21. Soak in the water for 15 to 20 minutes. 22. After the sitz bath, pat the affected area dry. GET HELP RIGHT AWAY IF: You get worse instead of better. Stop the sitz baths if you get worse. MAKE SURE YOU:  Understand these instructions.  Will watch your condition.  Will get help right away if you are not doing well or get worse. Document Released: 07/29/2004 Document Revised: 03/15/2012 Document Reviewed: 10/19/2010 ExitCare Patient Information 2015 [employer]. This information is not intended to replace advice given to you by your health care provider. Make sure you discuss any questions you have with your health care provider.    

## 2016-09-02 ENCOUNTER — Encounter: Payer: Self-pay | Admitting: Emergency Medicine

## 2016-09-02 ENCOUNTER — Emergency Department
Admission: EM | Admit: 2016-09-02 | Discharge: 2016-09-02 | Disposition: A | Discharge status: Home / Self Care | Payer: Medicaid Other | Attending: Student in an Organized Health Care Education/Training Program | Admitting: Student in an Organized Health Care Education/Training Program

## 2016-09-02 DIAGNOSIS — Z791 Long term (current) use of non-steroidal anti-inflammatories (NSAID): Secondary | ICD-10-CM | POA: Insufficient documentation

## 2016-09-02 DIAGNOSIS — R102 Pelvic and perineal pain: Secondary | ICD-10-CM | POA: Insufficient documentation

## 2016-09-02 DIAGNOSIS — F17200 Nicotine dependence, unspecified, uncomplicated: Secondary | ICD-10-CM | POA: Insufficient documentation

## 2016-09-02 DIAGNOSIS — R300 Dysuria: Secondary | ICD-10-CM | POA: Insufficient documentation

## 2016-09-02 LAB — WET PREP, GENITAL
CLUE CELLS WET PREP: NONE SEEN
SPERM: NONE SEEN
TRICH WET PREP: NONE SEEN
Yeast Wet Prep HPF POC: NONE SEEN

## 2016-09-02 LAB — URINALYSIS, COMPLETE (UACMP) WITH MICROSCOPIC
Bilirubin Urine: NEGATIVE
GLUCOSE, UA: NEGATIVE mg/dL
Ketones, ur: 5 mg/dL — AB
Nitrite: NEGATIVE
PH: 6 (ref 5.0–8.0)
Protein, ur: 30 mg/dL — AB
Specific Gravity, Urine: 1.013 (ref 1.005–1.030)

## 2016-09-02 LAB — CBC
HCT: 37.1 % (ref 35.0–47.0)
Hemoglobin: 12.5 g/dL (ref 12.0–16.0)
MCH: 30.4 pg (ref 26.0–34.0)
MCHC: 33.6 g/dL (ref 32.0–36.0)
MCV: 90.4 fL (ref 80.0–100.0)
PLATELETS: 342 10*3/uL (ref 150–440)
RBC: 4.1 MIL/uL (ref 3.80–5.20)
RDW: 13.3 % (ref 11.5–14.5)
WBC: 17.1 10*3/uL — ABNORMAL HIGH (ref 3.6–11.0)

## 2016-09-02 LAB — COMPREHENSIVE METABOLIC PANEL
ALK PHOS: 75 U/L (ref 38–126)
ALT: 14 U/L (ref 14–54)
AST: 20 U/L (ref 15–41)
Albumin: 4.7 g/dL (ref 3.5–5.0)
Anion gap: 8 (ref 5–15)
BUN: 10 mg/dL (ref 6–20)
CALCIUM: 9.2 mg/dL (ref 8.9–10.3)
CO2: 26 mmol/L (ref 22–32)
CREATININE: 0.6 mg/dL (ref 0.44–1.00)
Chloride: 103 mmol/L (ref 101–111)
GFR calc non Af Amer: >60 mL/min (ref >60)
GLUCOSE: 131 mg/dL — AB (ref 65–99)
Potassium: 3.5 mmol/L (ref 3.5–5.1)
SODIUM: 137 mmol/L (ref 135–145)
Total Bilirubin: 1 mg/dL (ref 0.3–1.2)
Total Protein: 8.3 g/dL — ABNORMAL HIGH (ref 6.5–8.1)

## 2016-09-02 LAB — CHLAMYDIA/NGC RT PCR (ARMC ONLY)
Chlamydia Tr: NOT DETECTED
N gonorrhoeae: NOT DETECTED

## 2016-09-02 LAB — POCT PREGNANCY, URINE: Preg Test, Ur: NEGATIVE

## 2016-09-02 MED ORDER — OXYCODONE-ACETAMINOPHEN 5-325 MG PO TABS
ORAL_TABLET | ORAL | Status: AC
  Administered 2016-09-02: 1 via ORAL
  Filled 2016-09-02: qty 1

## 2016-09-02 MED ORDER — DOXYCYCLINE HYCLATE 100 MG PO TABS
100.0000 mg | ORAL_TABLET | Freq: Once | ORAL | Status: AC
  Administered 2016-09-02: 100 mg via ORAL

## 2016-09-02 MED ORDER — DOXYCYCLINE HYCLATE 100 MG PO TABS
ORAL_TABLET | ORAL | Status: AC
  Administered 2016-09-02: 100 mg via ORAL
  Filled 2016-09-02: qty 1

## 2016-09-02 MED ORDER — AZITHROMYCIN 250 MG PO TABS
1000.0000 mg | ORAL_TABLET | Freq: Once | ORAL | Status: AC
  Administered 2016-09-02: 1000 mg via ORAL
  Filled 2016-09-02: qty 4

## 2016-09-02 MED ORDER — PROMETHAZINE HCL 12.5 MG PO TABS: 12.5000 mg | ORAL_TABLET | Freq: Four times a day (QID) | ORAL | 0 refills | Status: DC | PRN

## 2016-09-02 MED ORDER — OXYCODONE-ACETAMINOPHEN 5-325 MG PO TABS
1.0000 | ORAL_TABLET | Freq: Once | ORAL | Status: AC
  Administered 2016-09-02: 1 via ORAL

## 2016-09-02 MED ORDER — DOXYCYCLINE HYCLATE 50 MG PO CAPS: 100.0000 mg | ORAL_CAPSULE | Freq: Two times a day (BID) | ORAL | 0 refills | Status: AC

## 2016-09-02 MED ORDER — CEFTRIAXONE SODIUM 250 MG IJ SOLR
250.0000 mg | Freq: Once | INTRAMUSCULAR | Status: AC
  Administered 2016-09-02: 250 mg via INTRAMUSCULAR
  Filled 2016-09-02: qty 250

## 2016-09-02 NOTE — ED Triage Notes (Signed)
Patient with complaint of pain with urination and lower abdominal cramping times three days.

## 2016-09-02 NOTE — ED Provider Notes (Signed)
[facility] Emergency Department Provider Note    First MD Initiated Contact with Patient 09/02/16 2056     (approximate)  I have reviewed the triage vital signs and the nursing notes.   HISTORY  Chief Complaint Dysuria and Pelvic Pain    HPI Katie Walton is a 26 y.o. female with a chief complaint of several days of worsening dysuria and increased frequency as well as lower pelvic pain that is midline. Denies any flank pain. No fevers. No nausea or vomiting. Does have an IUD. Denies any chance of being pregnant. States this feels more severe than previous UTIs. Denies any vaginal discharge. No vaginal bleeding. Is sexually active with her boyfriend.   Past Medical History:  Diagnosis Date  . Anemia    No family history on file. Past Surgical History:  Procedure Laterality Date  . BACK SURGERY  26 years old   Patient Active Problem List   Diagnosis Date Noted  . Postpartum care following vaginal delivery 09/14/2015      Prior to Admission medications   Medication Sig Start Date End Date Taking? Authorizing Provider  acetaminophen (TYLENOL) 500 MG tablet Take 500 mg by mouth every 6 (six) hours as needed for mild pain.    Historical Provider, MD  albuterol (PROVENTIL HFA;VENTOLIN HFA) 108 (90 BASE) MCG/ACT inhaler Inhale 2 puffs into the lungs every 4 (four) hours as needed for wheezing or shortness of breath.    Historical Provider, MD  doxycycline (VIBRAMYCIN) 50 MG capsule Take 2 capsules (100 mg total) by mouth 2 (two) times daily. 09/02/16 09/12/16  Willy Eddy, MD  ibuprofen (ADVIL,MOTRIN) 600 MG tablet Take 1 tablet (600 mg total) by mouth every 6 (six) hours as needed for mild pain, moderate pain or cramping. 09/16/15   Farrel Conners, CNM  Prenatal Vit-Fe Fumarate-FA (PRENATAL MULTIVITAMIN) TABS tablet Take 1 tablet by mouth daily at 12 noon.    Historical Provider, MD    Allergies Patient has no known allergies.    Social  History Social History  Substance Use Topics  . Smoking status: Current Every Day Smoker    Packs/day: 0.50  . Smokeless tobacco: Never Used  . Alcohol use No    Review of Systems Patient denies headaches, rhinorrhea, blurry vision, numbness, shortness of breath, chest pain, edema, cough, abdominal pain, nausea, vomiting, diarrhea, dysuria, fevers, rashes or hallucinations unless otherwise stated above in HPI. ____________________________________________   PHYSICAL EXAM:  VITAL SIGNS: Vitals:   09/02/16 2034  BP: 115/63  Pulse: 100  Resp: 18  Temp: 98.3 F (36.8 C)    Constitutional: Alert and oriented. Well appearing and in no acute distress. Eyes: Conjunctivae are normal. PERRL. EOMI. Head: Atraumatic. Nose: No congestion/rhinnorhea. Mouth/Throat: Mucous membranes are moist.  Oropharynx non-erythematous. Neck: No stridor. Painless ROM. No cervical spine tenderness to palpation Hematological/Lymphatic/Immunilogical: No cervical lymphadenopathy. Cardiovascular: Normal rate, regular rhythm. Grossly normal heart sounds.  Good peripheral circulation. Respiratory: Normal respiratory effort.  No retractions. Lungs CTAB. Gastrointestinal: Soft and nontender. No distention. No abdominal bruits. No CVA tenderness. Genitourinary: no significant vaginal discharge but the cervix does appear irritated with scant purulence aroun intact IUD.  + CMT, no adnexal ttp or mass Musculoskeletal: No lower extremity tenderness nor edema.  No joint effusions. Neurologic:  Normal speech and language. No gross focal neurologic deficits are appreciated. No gait instability. Skin:  Skin is warm, dry and intact. No rash noted. Psychiatric: Mood and affect are normal. Speech and behavior are normal.  ____________________________________________   LABS (all labs ordered are listed, but only abnormal results are displayed)  Results for orders placed or performed during the hospital encounter of  09/02/16 (from the past 24 hour(s))  Comprehensive metabolic panel     Status: Abnormal   Collection Time: 09/02/16  8:38 PM  Result Value Ref Range   Sodium 137 135 - 145 mmol/L   Potassium 3.5 3.5 - 5.1 mmol/L   Chloride 103 101 - 111 mmol/L   CO2 26 22 - 32 mmol/L   Glucose, Bld 131 (H) 65 - 99 mg/dL   BUN 10 6 - 20 mg/dL   Creatinine, Ser 1.61 0.44 - 1.00 mg/dL   Calcium 9.2 8.9 - 09.6 mg/dL   Total Protein 8.3 (H) 6.5 - 8.1 g/dL   Albumin 4.7 3.5 - 5.0 g/dL   AST 20 15 - 41 U/L   ALT 14 14 - 54 U/L   Alkaline Phosphatase 75 38 - 126 U/L   Total Bilirubin 1.0 0.3 - 1.2 mg/dL   GFR calc non Af Amer >60 >60 mL/min   GFR calc Af Amer >60 >60 mL/min   Anion gap 8 5 - 15  CBC     Status: Abnormal   Collection Time: 09/02/16  8:38 PM  Result Value Ref Range   WBC 17.1 (H) 3.6 - 11.0 K/uL   RBC 4.10 3.80 - 5.20 MIL/uL   Hemoglobin 12.5 12.0 - 16.0 g/dL   HCT 04.5 40.9 - 81.1 %   MCV 90.4 80.0 - 100.0 fL   MCH 30.4 26.0 - 34.0 pg   MCHC 33.6 32.0 - 36.0 g/dL   RDW 91.4 78.2 - 95.6 %   Platelets 342 150 - 440 K/uL  Urinalysis, Complete w Microscopic     Status: Abnormal   Collection Time: 09/02/16  8:38 PM  Result Value Ref Range   Color, Urine YELLOW (A) YELLOW   APPearance CLOUDY (A) CLEAR   Specific Gravity, Urine 1.013 1.005 - 1.030   pH 6.0 5.0 - 8.0   Glucose, UA NEGATIVE NEGATIVE mg/dL   Hgb urine dipstick MODERATE (A) NEGATIVE   Bilirubin Urine NEGATIVE NEGATIVE   Ketones, ur 5 (A) NEGATIVE mg/dL   Protein, ur 30 (A) NEGATIVE mg/dL   Nitrite NEGATIVE NEGATIVE   Leukocytes, UA LARGE (A) NEGATIVE   RBC / HPF 6-30 0 - 5 RBC/hpf   WBC, UA TOO NUMEROUS TO COUNT 0 - 5 WBC/hpf   Bacteria, UA RARE (A) NONE SEEN   Squamous Epithelial / LPF 0-5 (A) NONE SEEN   WBC Clumps PRESENT   Pregnancy, urine POC     Status: None   Collection Time: 09/02/16  8:43 PM  Result Value Ref Range   Preg Test, Ur NEGATIVE NEGATIVE    ____________________________________________  EKG__________________________________  RADIOLOGY   ____________________________________________   PROCEDURES  Procedure(s) performed:  Procedures    Critical Care performed: no ____________________________________________   INITIAL IMPRESSION / ASSESSMENT AND PLAN / ED COURSE  Pertinent labs & imaging results that were available during my care of the patient were reviewed by me and considered in my medical decision making (see chart for details).  DDX: uti, cystitis, pid, toa,  Katie Walton is a 26 y.o. who presents to the ED with Symptoms of cystitis but also pelvic discomfort. Urinalysis does show evidence of acute cystitis. Pelvic exam is also concerning for PID but does not have any adnexal tenderness to suggest a TOA. Based on her white count  will treat empirically with IM Rocephin and azithromycin and follow up with complete PID treatment with doxycycline which will also cover her UTI. Do not feel further diagnostic testing clinically indicated at this time. Instructed patient to return immediately should her symptoms worsen. Also encouraged the patient to return or at least follow-up with some physician in the next 2-3 days if symptoms have not improved.  Patient was able to tolerate PO and was able to ambulate with a steady gait.  Have discussed with the patient and available family all diagnostics and treatments performed thus far and all questions were answered to the best of my ability. The patient demonstrates understanding and agreement with plan.       ____________________________________________   FINAL CLINICAL IMPRESSION(S) / ED DIAGNOSES  Final diagnoses:  Dysuria  Pelvic pain      NEW MEDICATIONS STARTED DURING THIS VISIT:  New Prescriptions   DOXYCYCLINE (VIBRAMYCIN) 50 MG CAPSULE    Take 2 capsules (100 mg total) by mouth 2 (two) times daily.     Note:  This document was prepared using Dragon  voice recognition software and may include unintentional dictation errors.    Willy EddyPatrick Carlis Blanchard, MD 09/02/16 2149

## 2021-04-13 ENCOUNTER — Emergency Department (HOSPITAL_COMMUNITY): Payer: Medicaid Other

## 2021-04-13 ENCOUNTER — Emergency Department (HOSPITAL_COMMUNITY)
Admission: EM | Admit: 2021-04-13 | Discharge: 2021-04-14 | Disposition: A | Discharge status: Home / Self Care | Payer: Medicaid Other | Attending: Emergency Medicine | Admitting: Emergency Medicine

## 2021-04-13 ENCOUNTER — Other Ambulatory Visit: Payer: Self-pay

## 2021-04-13 ENCOUNTER — Encounter (HOSPITAL_COMMUNITY): Payer: Self-pay

## 2021-04-13 DIAGNOSIS — S60512A Abrasion of left hand, initial encounter: Secondary | ICD-10-CM | POA: Insufficient documentation

## 2021-04-13 DIAGNOSIS — Z5329 Procedure and treatment not carried out because of patient's decision for other reasons: Secondary | ICD-10-CM

## 2021-04-13 DIAGNOSIS — Z5321 Procedure and treatment not carried out due to patient leaving prior to being seen by health care provider: Secondary | ICD-10-CM | POA: Diagnosis not present

## 2021-04-13 DIAGNOSIS — Y92410 Unspecified street and highway as the place of occurrence of the external cause: Secondary | ICD-10-CM | POA: Diagnosis not present

## 2021-04-13 DIAGNOSIS — S30811A Abrasion of abdominal wall, initial encounter: Secondary | ICD-10-CM | POA: Insufficient documentation

## 2021-04-13 DIAGNOSIS — S40922A Unspecified superficial injury of left upper arm, initial encounter: Secondary | ICD-10-CM | POA: Diagnosis present

## 2021-04-13 DIAGNOSIS — S40812A Abrasion of left upper arm, initial encounter: Secondary | ICD-10-CM | POA: Insufficient documentation
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
## 2021-04-14 NOTE — ED Notes (Signed)
Pt called to be roomed, no response 

## 2021-04-14 NOTE — ED Provider Notes (Signed)
It appears that patient left without being seen.    Marily Memos, MD 04/14/21 0127

## 2022-10-04 ENCOUNTER — Encounter: Payer: Self-pay | Admitting: Family

## 2022-10-04 ENCOUNTER — Ambulatory Visit: Payer: Medicaid Other | Admitting: Family

## 2022-10-04 DIAGNOSIS — Z113 Encounter for screening for infections with a predominantly sexual mode of transmission: Secondary | ICD-10-CM

## 2022-10-04 DIAGNOSIS — N760 Acute vaginitis: Secondary | ICD-10-CM

## 2022-10-04 LAB — WET PREP FOR TRICH, YEAST, CLUE
Trichomonas Exam: NEGATIVE
Yeast Exam: NEGATIVE

## 2022-10-04 LAB — HM HIV SCREENING LAB: HM HIV Screening: NEGATIVE

## 2022-10-04 MED ORDER — METRONIDAZOLE 500 MG PO TABS: 500.0000 mg | ORAL_TABLET | Freq: Two times a day (BID) | ORAL | 0 refills | Status: AC

## 2022-10-04 NOTE — Progress Notes (Unsigned)
Pt here for routine STI screen, denies symptoms.  Wet prep reviewed; treated for BV per SO.  Harland Dingwall, RN

## 2022-10-04 NOTE — Progress Notes (Signed)
[facility] Department  STI clinic/screening visit [place] [address] [phone]  Subjective:  Katie Walton is a 32 y.o. female being seen today for an STI screening visit. The patient reports they do not have symptoms.  Patient reports that they do not desire a pregnancy in the next year.   They reported they are not interested in discussing contraception today.    No LMP recorded. (Menstrual status: IUD).  Patient has the following medical conditions:   Patient Active Problem List   Diagnosis Date Noted  . Postpartum care following vaginal delivery 09/14/2015    Chief Complaint  Patient presents with  . STI screening    Pt wants a routine STI screening; denies symptoms    HPI  Patient reports just came out of a long term relationship and wants to get checked to make sure he did not give her any infections before he left.   Does the patient using douching products? No  Last HIV test per patient/review of record was No results found for: "HMHIVSCREEN"  Lab Results  Component Value Date   HIV Non-reactive 06/19/2015   Patient reports last pap was No results found for: "DIAGPAP" No results found for: "SPECADGYN"  Screening for MPX risk: Does the patient have an unexplained rash? No Is the patient MSM? No Does the patient endorse multiple sex partners or anonymous sex partners? No Did the patient have close or sexual contact with a person diagnosed with MPX? No Has the patient traveled outside the [place] where MPX is endemic? No Is there a high clinical suspicion for MPX-- evidenced by one of the following No  -Unlikely to be chickenpox  -Lymphadenopathy  -Rash that present in same phase of evolution on any given body part See flowsheet for further details and programmatic requirements.   Immunization history:   There is no immunization history on file for this patient.   The following portions of the patient's history were  reviewed and updated as appropriate: allergies, current medications, past medical history, past social history, past surgical history and problem list.  Objective:  There were no vitals filed for this visit.  Physical Exam   Assessment and Plan:  Katie Walton is a 32 y.o. female presenting to the [facility] Department for STI screening  1. Screening for venereal disease ***   Patient accepted all screenings including ***oral, vaginal CT/GC and bloodwork for HIV/RPR, and wet prep. Patient meets criteria for HepB screening? {yes/no:20286}. Ordered? {Response; yes/no/na:63} Patient meets criteria for HepC screening? {yes/no:20286}. Ordered? {Response; yes/no/na:63}  Treat wet prep per standing order Discussed time line for State Lab results and that patient will be called with positive results and encouraged patient to call if she had not heard in 2 weeks.  Counseled to return or seek care for continued or worsening symptoms Recommended repeat testing in 3 months with positive results. Recommended condom use with all sex  Patient is currently using {[facility]O Contraception:21020264} to prevent pregnancy.    Return if symptoms worsen or fail to improve.  No future appointments.  Marline Backbone, FNP

## 2022-10-13 ENCOUNTER — Encounter: Payer: Self-pay | Admitting: Family Medicine
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
## 2023-06-15 ENCOUNTER — Other Ambulatory Visit: Payer: Self-pay | Admitting: Physical Medicine & Rehabilitation

## 2023-06-15 DIAGNOSIS — M5416 Radiculopathy, lumbar region: Secondary | ICD-10-CM

## 2023-06-15 DIAGNOSIS — M5441 Lumbago with sciatica, right side: Secondary | ICD-10-CM

## 2023-06-15 DIAGNOSIS — R29898 Other symptoms and signs involving the musculoskeletal system: Secondary | ICD-10-CM

## 2023-06-22 ENCOUNTER — Encounter: Payer: Self-pay | Admitting: Physical Medicine & Rehabilitation

## 2023-07-01 ENCOUNTER — Ambulatory Visit
Admission: RE | Admit: 2023-07-01 | Discharge: 2023-07-01 | Disposition: A | Discharge status: Home / Self Care | Payer: Medicaid Other | Source: Ambulatory Visit | Attending: Physical Medicine & Rehabilitation | Admitting: Physical Medicine & Rehabilitation

## 2023-07-01 DIAGNOSIS — M5416 Radiculopathy, lumbar region: Secondary | ICD-10-CM

## 2023-07-01 DIAGNOSIS — M5441 Lumbago with sciatica, right side: Secondary | ICD-10-CM

## 2023-07-01 DIAGNOSIS — R29898 Other symptoms and signs involving the musculoskeletal system: Secondary | ICD-10-CM

## 2023-07-19 NOTE — Progress Notes (Signed)
 Chief Complaint: Chief Complaint  Patient presents with   Follow-up  Back pain traveling into the right leg  HPI: Patient is a pleasant 33 y.o. female seen in follow-up for the evaluation of back pain traveling into the right leg.  She is accompanied by her partner.  She is here to review MRI of the lumbar spine.  She does note about 30% improvement since I last saw her.  She rates her pain as a 4/10.  Is constant, dull, aching, tight.  She does complain of numbness, tingling and weakness in the right leg but denies any loss of control of bowel or bladder.  She states that the weakness has nearly resolved in the right leg and she has been a lot more functional and able to move around a lot better since I last saw her.  She states that the pain has also improved significantly where she does not feel like she needs an epidural injection.  Patient endorses that her pain is been going on for about 7-8 weeks.  She denies any inciting injury but states that she was moving at the time and thinks that this may have precipitated her pain.  This did cause her to go to our walk-in clinic twice.  She did have x-rays done.  She was also prescribed Flexeril, baclofen, meloxicam and hydrocodone  as well as an oral prednisone taper.  Pain is worse with standing, walking, lifting, twisting and better with sitting, laying down, rest, elevation, heat and ice.  She has currently been working light duty.  Overall she does feel like her pain level is tolerable and she is a lot more functional.  Review of Systems: A 10 point review of systems is negative, except for the pertinent positives and negatives detailed in the HPI.  PMH: No past medical history on file.   PSH: Past Surgical History:  Procedure Laterality Date   BACK SURGERY       Family History: No family history on file.   Social History: Social History   Socioeconomic History   Marital status: Married  Tobacco Use   Smoking status: Every Day     Current packs/day: 1.00    Types: Cigarettes  Vaping Use   Vaping status: Never Used  Substance and Sexual Activity   Alcohol use: No   Drug use: Never   Sexual activity: Defer  Social History Narrative   Patient is single and lives with her parents and two younger brothers. Has a high school diploma. Unemployed.    3-4 caffeinated beverages a day.    No guns. No exercise. Usually wears seatbelts.    Social Drivers of Corporate Investment Banker Strain: Low Risk  (06/15/2023)   Overall Financial Resource Strain (CARDIA)    Difficulty of Paying Living Expenses: Not very hard  Recent Concern: Financial Resource Strain - Medium Risk (05/24/2023)   Overall Financial Resource Strain (CARDIA)    Difficulty of Paying Living Expenses: Somewhat hard  Food Insecurity: No Food Insecurity (06/15/2023)   Hunger Vital Sign    Worried About Running Out of Food in the Last Year: Never true    Ran Out of Food in the Last Year: Never true  Transportation Needs: No Transportation Needs (06/15/2023)   PRAPARE - Administrator, Civil Service (Medical): No    Lack of Transportation (Non-Medical): No     Allergies: Allergies  Allergen Reactions   Septra Ds [Sulfamethoxazole-Trimethoprim] Hives     Medications:  Current Outpatient  Medications:    gabapentin (NEURONTIN) 300 MG capsule, 1 po tid, Disp: 90 capsule, Rfl: 5   HYDROcodone -acetaminophen  (NORCO) 5-325 mg tablet, Take one tablet at night for pain; may take up to every 8 hours as needed for pain if not working or driving (Patient not taking: Reported on 07/19/2023), Disp: 15 tablet, Rfl: 0   predniSONE (DELTASONE) 10 MG tablet, 6 PO Q D X 1 DAY, THEN 5 PO Q D X 1 DAY, THEN 4 PO Q D X 1 DAY, THEN 3 PO Q D X 1 DAY, THEN 2 PO Q D X 1 DAY, THEN 1 PO Q D X 1 DAY (Patient not taking: Reported on 07/19/2023), Disp: 21 tablet, Rfl: 0  Vitals: Vitals:   07/19/23 1326  BP: 125/60  Pulse: 89  Temp: 37 C (98.6 F)   TempSrc: Oral  Weight: 65.8 kg (145 lb)  Height: 162.6 cm (5' 4.02)  PainSc:   4  PainLoc: Back    Physical exam: 5/5 bilateral dorsi flexion  Imaging: X-ray lumbar spine 05/2023: No evidence of lumbar spine fracture or subluxation; no significant degenerative disease  MRI lumbar spine done at [facility] 07/2023: L4-5 superimposed prominent right subarticular disc extrusion with severe right LRS and impingement of the right L5 nerve, mild CCS, moderate right NFS; L5-S1 right superimposed large disc extrusion with severe CCS, severe right and moderate to severe left LRS with impingement of the right S1 nerve and probable impingement of the left S1 nerve, severe right NFS; partial lumbarization of S1  Assessment: Acute low back pain with right lumbar radiculitis  History of lumbar discectomy and L4-5 left hemilaminectomy by Dr. Delores at [facility] 2007  Plan: I did review the MRI lumbar spine and discussed it with her in detail using a spine model.  I did discuss the disc extrusions at L4-5 and L5-S1.  She is noting significant improvement since I last saw her.  Her weakness has resolved in the right foot.  I did briefly discuss potential for surgical referral but I do not feel that it is necessary now given that she has improved so much.  I also discussed the epidural injection but she feels that she does not need this right now.  I also discussed physical therapy with her as well as avoiding any activities that cause her pain.  I also cautioned her to avoid any repetitive bending/twisting at the waist which could exacerbate the disc herniation.  She verbalized understanding.  She is interested in physical therapy and doing this in [place] at [facility].  Referral placed.    She is noting significant improvement with the gabapentin 300 mg 3 times daily.  She does feel slightly lethargic on it but otherwise is tolerating it.  I did tell her that she can trial dropping the afternoon dose and just taking  it twice a day to see how she does.  She verbalized understanding.  I did send a prescription of gabapentin 300 mg 3 times daily to her pharmacy.  With refills  I did review the [place]  controlled substance database.  There is no suspicious activity.  She does need a work note which I have provided her in the past. Light duty no lifting greater than 10 pounds no repetitive bending or twisting at the waist for 6 weeks.  If she does need this extended I would have to see her back in clinic.  Follow-up as needed.    Patient agrees with above plan.  Answered  all questions.

## 2024-06-17 ENCOUNTER — Observation Stay: Admission: EM | Admit: 2024-06-17 | Discharge: 2024-06-18 | Disposition: A

## 2024-06-17 ENCOUNTER — Emergency Department

## 2024-06-17 ENCOUNTER — Other Ambulatory Visit: Payer: Self-pay

## 2024-06-17 DIAGNOSIS — Z87891 Personal history of nicotine dependence: Secondary | ICD-10-CM | POA: Diagnosis not present

## 2024-06-17 DIAGNOSIS — K353 Acute appendicitis with localized peritonitis, without perforation or gangrene: Secondary | ICD-10-CM | POA: Diagnosis not present

## 2024-06-17 DIAGNOSIS — R109 Unspecified abdominal pain: Secondary | ICD-10-CM | POA: Diagnosis present

## 2024-06-17 LAB — COMPREHENSIVE METABOLIC PANEL WITH GFR
ALT: 29 U/L (ref 0–44)
AST: 25 U/L (ref 15–41)
Albumin: 4.8 g/dL (ref 3.5–5.0)
Alkaline Phosphatase: 82 U/L (ref 38–126)
Anion gap: 15 (ref 5–15)
BUN: 14 mg/dL (ref 6–20)
CO2: 22 mmol/L (ref 22–32)
Calcium: 9.4 mg/dL (ref 8.9–10.3)
Chloride: 102 mmol/L (ref 98–111)
Creatinine, Ser: 0.61 mg/dL (ref 0.44–1.00)
GFR, Estimated: >60 mL/min (ref >60)
Glucose, Bld: 168 mg/dL — ABNORMAL HIGH (ref 70–99)
Potassium: 3.5 mmol/L (ref 3.5–5.1)
Sodium: 139 mmol/L (ref 135–145)
Total Bilirubin: 0.5 mg/dL (ref 0.0–1.2)
Total Protein: 7.6 g/dL (ref 6.5–8.1)

## 2024-06-17 LAB — URINALYSIS, ROUTINE W REFLEX MICROSCOPIC
Bacteria, UA: NONE SEEN
Bilirubin Urine: NEGATIVE
Glucose, UA: NEGATIVE mg/dL
Ketones, ur: 5 mg/dL — AB
Leukocytes,Ua: NEGATIVE
Nitrite: NEGATIVE
Protein, ur: 30 mg/dL — AB
Specific Gravity, Urine: 1.03 (ref 1.005–1.030)
pH: 5 (ref 5.0–8.0)

## 2024-06-17 LAB — CBC
HCT: 38.7 % (ref 36.0–46.0)
Hemoglobin: 12.9 g/dL (ref 12.0–15.0)
MCH: 30.8 pg (ref 26.0–34.0)
MCHC: 33.3 g/dL (ref 30.0–36.0)
MCV: 92.4 fL (ref 80.0–100.0)
Platelets: 375 K/uL (ref 150–400)
RBC: 4.19 MIL/uL (ref 3.87–5.11)
RDW: 12.7 % (ref 11.5–15.5)
WBC: 25.4 K/uL — ABNORMAL HIGH (ref 4.0–10.5)
nRBC: 0 % (ref 0.0–0.2)

## 2024-06-17 LAB — LIPASE, BLOOD: Lipase: 19 U/L (ref 11–51)

## 2024-06-17 LAB — POC URINE PREG, ED: Preg Test, Ur: NEGATIVE

## 2024-06-17 MED ORDER — IOHEXOL 300 MG/ML  SOLN
100.0000 mL | Freq: Once | INTRAMUSCULAR | Status: AC | PRN
  Administered 2024-06-17: 100 mL via INTRAVENOUS

## 2024-06-17 MED ORDER — ACETAMINOPHEN 650 MG RE SUPP: 650.0000 mg | Freq: Four times a day (QID) | RECTAL | Status: DC | PRN

## 2024-06-17 MED ORDER — PIPERACILLIN-TAZOBACTAM 3.375 G IVPB
3.3750 g | Freq: Three times a day (TID) | INTRAVENOUS | Status: DC
  Administered 2024-06-18 (×3): 3.375 g via INTRAVENOUS
  Filled 2024-06-17 (×3): qty 50

## 2024-06-17 MED ORDER — MORPHINE SULFATE (PF) 4 MG/ML IV SOLN
4.0000 mg | Freq: Once | INTRAVENOUS | Status: AC
  Administered 2024-06-18: 4 mg via INTRAVENOUS
  Filled 2024-06-17: qty 1

## 2024-06-17 MED ORDER — ONDANSETRON HCL 4 MG/2ML IJ SOLN
4.0000 mg | Freq: Once | INTRAMUSCULAR | Status: AC
  Administered 2024-06-17: 4 mg via INTRAVENOUS
  Filled 2024-06-17: qty 2

## 2024-06-17 MED ORDER — MORPHINE SULFATE (PF) 4 MG/ML IV SOLN
4.0000 mg | INTRAVENOUS | Status: DC | PRN
  Administered 2024-06-18 (×2): 4 mg via INTRAVENOUS
  Filled 2024-06-17 (×2): qty 1

## 2024-06-17 MED ORDER — ONDANSETRON 4 MG PO TBDP: 4.0000 mg | ORAL_TABLET | Freq: Four times a day (QID) | ORAL | Status: DC | PRN

## 2024-06-17 MED ORDER — ACETAMINOPHEN 325 MG PO TABS: 650.0000 mg | ORAL_TABLET | Freq: Four times a day (QID) | ORAL | Status: DC | PRN

## 2024-06-17 MED ORDER — KETOROLAC TROMETHAMINE 15 MG/ML IJ SOLN
15.0000 mg | Freq: Once | INTRAMUSCULAR | Status: AC
  Administered 2024-06-17: 15 mg via INTRAVENOUS
  Filled 2024-06-17: qty 1

## 2024-06-17 MED ORDER — ENOXAPARIN SODIUM 40 MG/0.4ML IJ SOSY: 40.0000 mg | PREFILLED_SYRINGE | INTRAMUSCULAR | Status: DC

## 2024-06-17 MED ORDER — HYDROCODONE-ACETAMINOPHEN 5-325 MG PO TABS
1.0000 | ORAL_TABLET | ORAL | Status: DC | PRN
  Administered 2024-06-18: 13:00:00 1 via ORAL
  Filled 2024-06-17: qty 1

## 2024-06-17 MED ORDER — SODIUM CHLORIDE 0.9 % IV SOLN: INTRAVENOUS | Status: DC

## 2024-06-17 MED ORDER — ONDANSETRON HCL 4 MG/2ML IJ SOLN: 4.0000 mg | Freq: Four times a day (QID) | INTRAMUSCULAR | Status: DC | PRN

## 2024-06-17 NOTE — ED Triage Notes (Signed)
 Pt to ED for RLQ pain since today. Endorses N/V today nonstop until coming here. Similar episode 1 month ago. States pain radiates toward umbilicus. No dysuria. Has IUD. Skin dry, respirations unlabored.

## 2024-06-17 NOTE — ED Provider Notes (Signed)
 [facility] Provider Note    Event Date/Time   First MD Initiated Contact with Patient 06/17/24 2056     (approximate)   History   Abdominal Pain   HPI  Katie Walton is a 33 y.o. female who presents today with concern of abdominal pain.  Apparently had similar symptoms about 1 month ago, she developed right lower quadrant abdominal pain, this was accompanied with excessive vaginal bleeding that occurred 1 week after her normal menstrual period.  She states this was over a month ago, now today developed again similar right lower quadrant abdominal pain, distant with some vaginal spotting.  Denies fevers chills some minor nausea which is now improved.  Denies any other prior medical history, no new sexual partners, does have 2 history of pregnancies without any complications.  No surgical history.     Physical Exam   Triage Vital Signs: ED Triage Vitals  Encounter Vitals Group     BP 06/17/24 1803 (!) 123/57     Girls Systolic BP Percentile --      Girls Diastolic BP Percentile --      Boys Systolic BP Percentile --      Boys Diastolic BP Percentile --      Pulse Rate 06/17/24 1803 90     Resp 06/17/24 1803 16     Temp 06/17/24 1803 98.3 F (36.8 C)     Temp Source 06/17/24 1803 Oral     SpO2 06/17/24 1803 100 %     Weight 06/17/24 1805 145 lb (65.8 kg)     Height 06/17/24 1805 5' 4 (1.626 m)     Head Circumference --      Peak Flow --      Pain Score 06/17/24 1804 6     Pain Loc --      Pain Education --      Exclude from Growth Chart --     Most recent vital signs: Vitals:   06/17/24 1803 06/17/24 2124  BP: (!) 123/57 127/70  Pulse: 90 89  Resp: 16 18  Temp: 98.3 F (36.8 C) 98.7 F (37.1 C)  SpO2: 100% 100%     General: Awake, no distress.  CV:  Good peripheral perfusion.  Resp:  Normal effort.  Abd:  No distention.  Soft with discomfort to palpation around the right lower quadrant of the abdomen Other:     ED Results /  Procedures / Treatments   Labs (all labs ordered are listed, but only abnormal results are displayed) Labs Reviewed  COMPREHENSIVE METABOLIC PANEL WITH GFR - Abnormal; Notable for the following components:      Result Value   Glucose, Bld 168 (*)    All other components within normal limits  CBC - Abnormal; Notable for the following components:   WBC 25.4 (*)    All other components within normal limits  URINALYSIS, ROUTINE W REFLEX MICROSCOPIC - Abnormal; Notable for the following components:   Color, Urine YELLOW (*)    APPearance HAZY (*)    Hgb urine dipstick MODERATE (*)    Ketones, ur 5 (*)    Protein, ur 30 (*)    All other components within normal limits  LIPASE, BLOOD  HCG, QUANTITATIVE, PREGNANCY  HIV ANTIBODY (ROUTINE TESTING W REFLEX)  POC URINE PREG, ED     EKG     RADIOLOGY   PROCEDURES:  Critical Care performed: No  Procedures   MEDICATIONS ORDERED IN ED: Medications  morphine  (  PF) 4 MG/ML injection 4 mg (has no administration in time range)  piperacillin -tazobactam (ZOSYN ) IVPB 3.375 g (has no administration in time range)  enoxaparin  (LOVENOX ) injection 40 mg (has no administration in time range)  0.9 %  sodium chloride  infusion (has no administration in time range)  acetaminophen  (TYLENOL ) tablet 650 mg (has no administration in time range)    Or  acetaminophen  (TYLENOL ) suppository 650 mg (has no administration in time range)  HYDROcodone -acetaminophen  (NORCO/VICODIN) 5-325 MG per tablet 1-2 tablet (has no administration in time range)  morphine  (PF) 4 MG/ML injection 4 mg (has no administration in time range)  ondansetron  (ZOFRAN -ODT) disintegrating tablet 4 mg (has no administration in time range)    Or  ondansetron  (ZOFRAN ) injection 4 mg (has no administration in time range)  ketorolac  (TORADOL ) 15 MG/ML injection 15 mg (15 mg Intravenous Given 06/17/24 2218)  ondansetron  (ZOFRAN ) injection 4 mg (4 mg Intravenous Given 06/17/24 2217)   iohexol  (OMNIPAQUE ) 300 MG/ML solution 100 mL (100 mLs Intravenous Contrast Given 06/17/24 2235)     IMPRESSION / MDM / ASSESSMENT AND PLAN / ED COURSE  I reviewed the triage vital signs and the nursing notes.                               Patient's presentation is most consistent with acute complicated illness / injury requiring diagnostic workup.  33 year old female who presents today with concern of right lower quadrant abdominal pain.  Given her history of bleeding and significant pain about 1 month prior and now resolution with recurrence today, considering possible ovarian pathology.  She denies chance of pregnancy and states that she has an IUD present, will initiate workup with ultrasound imaging, but given her significant leukocytosis if this is negative we will need workup for possible appendicitis.   Clinical Course as of 06/18/24 0002  Sun Jun 17, 2024  2150 I spoke with Dr. Cesar who has agreed to plan for admission and anticipated surgery tomorrow morning. [SK]  2214 Ultrasound without acute findings, given the persistence of her pain and her significant leukocytosis, will obtain CT imaging to further assess for possible appendicitis. [SK]  2256 CT imaging showing evidence of early appendicitis.  I will reach out to general surgery and discuss further management. [SK]    Clinical Course User Index [SK] Fernand Rossie HERO, MD     FINAL CLINICAL IMPRESSION(S) / ED DIAGNOSES   Final diagnoses:  Acute appendicitis with localized peritonitis, without perforation, abscess, or gangrene     Rx / DC Orders   ED Discharge Orders     None        Note:  This document was prepared using Dragon voice recognition software and may include unintentional dictation errors.   Fernand Rossie HERO, MD 06/18/24 STANLY

## 2024-06-18 ENCOUNTER — Inpatient Hospital Stay: Admitting: Certified Registered"

## 2024-06-18 ENCOUNTER — Encounter: Admission: EM | Disposition: A | Payer: Self-pay | Source: Home / Self Care

## 2024-06-18 ENCOUNTER — Other Ambulatory Visit: Payer: Self-pay

## 2024-06-18 ENCOUNTER — Encounter: Payer: Self-pay | Admitting: General Surgery

## 2024-06-18 HISTORY — PX: XI ROBOTIC LAPAROSCOPIC ASSISTED APPENDECTOMY: SHX6877

## 2024-06-18 LAB — HCG, QUANTITATIVE, PREGNANCY: hCG, Beta Chain, Quant, S: <1 m[IU]/mL (ref <5)

## 2024-06-18 LAB — HIV ANTIBODY (ROUTINE TESTING W REFLEX): HIV Screen 4th Generation wRfx: NONREACTIVE

## 2024-06-18 SURGERY — APPENDECTOMY, ROBOT-ASSISTED, LAPAROSCOPIC
Anesthesia: General | Site: Pelvis

## 2024-06-18 MED ORDER — FENTANYL CITRATE (PF) 100 MCG/2ML IJ SOLN
INTRAMUSCULAR | Status: DC | PRN
  Administered 2024-06-18: 11:00:00 100 ug via INTRAVENOUS
  Administered 2024-06-18 (×2): 50 ug via INTRAVENOUS

## 2024-06-18 MED ORDER — OXYCODONE HCL 5 MG/5ML PO SOLN: 5.0000 mg | Freq: Once | ORAL | Status: DC | PRN

## 2024-06-18 MED ORDER — ACETAMINOPHEN 10 MG/ML IV SOLN
INTRAVENOUS | Status: DC | PRN
  Administered 2024-06-18: 10:00:00 1000 mg via INTRAVENOUS

## 2024-06-18 MED ORDER — LIDOCAINE HCL (CARDIAC) PF 100 MG/5ML IV SOSY
PREFILLED_SYRINGE | INTRAVENOUS | Status: DC | PRN
  Administered 2024-06-18: 10:00:00 100 mg via INTRAVENOUS

## 2024-06-18 MED ORDER — FENTANYL CITRATE (PF) 100 MCG/2ML IJ SOLN
25.0000 ug | INTRAMUSCULAR | Status: DC | PRN
  Administered 2024-06-18: 11:00:00 25 ug via INTRAVENOUS

## 2024-06-18 MED ORDER — FENTANYL CITRATE (PF) 100 MCG/2ML IJ SOLN
INTRAMUSCULAR | Status: AC
  Filled 2024-06-18: qty 2

## 2024-06-18 MED ORDER — DEXMEDETOMIDINE HCL IN NACL 200 MCG/50ML IV SOLN
INTRAVENOUS | Status: DC | PRN
  Administered 2024-06-18 (×2): 12 ug via INTRAVENOUS

## 2024-06-18 MED ORDER — SUGAMMADEX SODIUM 200 MG/2ML IV SOLN
INTRAVENOUS | Status: DC | PRN
  Administered 2024-06-18: 10:00:00 20 mg via INTRAVENOUS

## 2024-06-18 MED ORDER — SUCCINYLCHOLINE CHLORIDE 200 MG/10ML IV SOSY
PREFILLED_SYRINGE | INTRAVENOUS | Status: DC | PRN
  Administered 2024-06-18: 10:00:00 100 mg via INTRAVENOUS

## 2024-06-18 MED ORDER — BUPIVACAINE-EPINEPHRINE (PF) 0.5% -1:200000 IJ SOLN
INTRAMUSCULAR | Status: AC
  Filled 2024-06-18: qty 30

## 2024-06-18 MED ORDER — HYDROCODONE-ACETAMINOPHEN 5-325 MG PO TABS
1.0000 | ORAL_TABLET | Freq: Four times a day (QID) | ORAL | 0 refills | Status: AC | PRN
  Filled 2024-06-18: qty 12, 3d supply, fill #0

## 2024-06-18 MED ORDER — GLYCOPYRROLATE 0.2 MG/ML IJ SOLN
INTRAMUSCULAR | Status: DC | PRN
  Administered 2024-06-18: 10:00:00 .2 mg via INTRAVENOUS

## 2024-06-18 MED ORDER — MIDAZOLAM HCL 2 MG/2ML IJ SOLN
INTRAMUSCULAR | Status: AC
  Filled 2024-06-18: qty 2

## 2024-06-18 MED ORDER — ROCURONIUM BROMIDE 100 MG/10ML IV SOLN
INTRAVENOUS | Status: DC | PRN
  Administered 2024-06-18: 10:00:00 10 mg via INTRAVENOUS
  Administered 2024-06-18: 10:00:00 40 mg via INTRAVENOUS

## 2024-06-18 MED ORDER — ONDANSETRON HCL 4 MG/2ML IJ SOLN
INTRAMUSCULAR | Status: DC | PRN
  Administered 2024-06-18 (×2): 4 mg via INTRAVENOUS

## 2024-06-18 MED ORDER — CEFAZOLIN SODIUM-DEXTROSE 2-3 GM-%(50ML) IV SOLR
INTRAVENOUS | Status: DC | PRN
  Administered 2024-06-18: 10:00:00 2 g via INTRAVENOUS

## 2024-06-18 MED ORDER — PIPERACILLIN-TAZOBACTAM 3.375 G IVPB
INTRAVENOUS | Status: AC
  Filled 2024-06-18: qty 50

## 2024-06-18 MED ORDER — DEXAMETHASONE SOD PHOSPHATE PF 10 MG/ML IJ SOLN
INTRAMUSCULAR | Status: DC | PRN
  Administered 2024-06-18: 10:00:00 10 mg via INTRAVENOUS

## 2024-06-18 MED ORDER — BUPIVACAINE-EPINEPHRINE (PF) 0.5% -1:200000 IJ SOLN
INTRAMUSCULAR | Status: DC | PRN
  Administered 2024-06-18: 10:00:00 30 mL

## 2024-06-18 MED ORDER — 0.9 % SODIUM CHLORIDE (POUR BTL) OPTIME
TOPICAL | Status: DC | PRN
  Administered 2024-06-18: 10:00:00 500 mL

## 2024-06-18 MED ORDER — ACETAMINOPHEN 10 MG/ML IV SOLN
INTRAVENOUS | Status: AC
  Filled 2024-06-18: qty 100

## 2024-06-18 MED ORDER — PROPOFOL 10 MG/ML IV BOLUS
INTRAVENOUS | Status: DC | PRN
  Administered 2024-06-18: 10:00:00 200 mg via INTRAVENOUS

## 2024-06-18 MED ORDER — CEFAZOLIN SODIUM-DEXTROSE 2-4 GM/100ML-% IV SOLN: 2.0000 g | Freq: Once | INTRAVENOUS | Status: DC

## 2024-06-18 MED ORDER — OXYCODONE HCL 5 MG PO TABS: 5.0000 mg | ORAL_TABLET | Freq: Once | ORAL | Status: DC | PRN

## 2024-06-18 SURGICAL SUPPLY — 44 items
BAG PRESSURE INF REUSE 1000 (BAG) IMPLANT
CANNULA REDUCER 12-8 DVNC XI (CANNULA) ×1 IMPLANT
COVER TIP SHEARS 8 DVNC (MISCELLANEOUS) ×1 IMPLANT
DEFOGGER SCOPE WARM SEASHARP (MISCELLANEOUS) ×1 IMPLANT
DERMABOND ADVANCED .7 DNX12 (GAUZE/BANDAGES/DRESSINGS) ×1 IMPLANT
DRAPE ARM DVNC X/XI (DISPOSABLE) ×4 IMPLANT
DRAPE COLUMN DVNC XI (DISPOSABLE) ×1 IMPLANT
ELECTRODE REM PT RTRN 9FT ADLT (ELECTROSURGICAL) ×1 IMPLANT
FORCEPS BPLR FENES DVNC XI (FORCEP) ×1 IMPLANT
GLOVE BIOGEL PI IND STRL 6.5 (GLOVE) ×2 IMPLANT
GLOVE SURG SYN 6.5 PF PI (GLOVE) ×4 IMPLANT
GOWN STRL REUS W/ TWL LRG LVL3 (GOWN DISPOSABLE) ×4 IMPLANT
GRASPER SUT TROCAR 14GX15 (MISCELLANEOUS) IMPLANT
GRASPER TIP-UP FEN DVNC XI (INSTRUMENTS) ×1 IMPLANT
IRRIGATOR SUCT 8 DISP DVNC XI (IRRIGATION / IRRIGATOR) IMPLANT
IV 0.9% NACL 1000 ML (IV SOLUTION) IMPLANT
KIT PINK PAD W/HEAD ARM REST (MISCELLANEOUS) ×1 IMPLANT
LABEL OR SOLS (LABEL) IMPLANT
MANIFOLD NEPTUNE II (INSTRUMENTS) ×1 IMPLANT
NDL DRIVE SUT CUT DVNC (INSTRUMENTS) ×1 IMPLANT
NDL HYPO 22X1.5 SAFETY MO (MISCELLANEOUS) ×1 IMPLANT
NDL INSUFFLATION 14GA 120MM (NEEDLE) ×1 IMPLANT
NEEDLE DRIVE SUT CUT DVNC (INSTRUMENTS) ×1 IMPLANT
NEEDLE HYPO 22X1.5 SAFETY MO (MISCELLANEOUS) ×1 IMPLANT
NEEDLE INSUFFLATION 14GA 120MM (NEEDLE) ×1 IMPLANT
OBTURATOR OPTICALSTD 8 DVNC (TROCAR) ×1 IMPLANT
PACK LAP CHOLECYSTECTOMY (MISCELLANEOUS) ×1 IMPLANT
POUCH ENDO CATCH 10MM SPEC (MISCELLANEOUS) IMPLANT
RELOAD STAPLE 45 2.5 WHT DVNC (STAPLE) IMPLANT
RELOAD STAPLE 45 3.5 BLU DVNC (STAPLE) IMPLANT
SCISSORS MNPLR CVD DVNC XI (INSTRUMENTS) ×1 IMPLANT
SEAL UNIV 5-12 XI (MISCELLANEOUS) ×4 IMPLANT
SEALER VESSEL EXT DVNC XI (MISCELLANEOUS) IMPLANT
SET TUBE SMOKE EVAC HIGH FLOW (TUBING) ×1 IMPLANT
SOLN STERILE WATER 500 ML (IV SOLUTION) ×1 IMPLANT
SOLUTION ELECTROSURG ANTI STCK (MISCELLANEOUS) ×1 IMPLANT
SPONGE T-LAP 4X18 ~~LOC~~+RFID (SPONGE) ×1 IMPLANT
STAPLER 45 SUREFORM DVNC (STAPLE) IMPLANT
SUT MNCRL AB 4-0 PS2 18 (SUTURE) ×1 IMPLANT
SUT STRATA 3-0 SH (SUTURE) IMPLANT
SUT VICRYL 0 UR6 27IN ABS (SUTURE) ×1 IMPLANT
SYR 30ML LL (SYRINGE) ×1 IMPLANT
SYSTEM BAG RETRIEVAL 10MM (BASKET) ×1 IMPLANT
TRAY FOLEY MTR SLVR 16FR STAT (SET/KITS/TRAYS/PACK) IMPLANT

## 2024-06-18 NOTE — Plan of Care (Signed)
 Transferred to Room 345. Patient oriented to Room, use of [employer], Safety and Security and [employer]. Pt. V/o.

## 2024-06-18 NOTE — H&P (Signed)
 SURGICAL HISTORY AND PHYSICAL NOTE   HISTORY OF PRESENT ILLNESS (HPI):  33 y.o. female presented to [facility] ED for evaluation of abdominal pain. Patient reports started having abdominal pain last night.  Pain in the right lower quadrant.  There is no radiation.  Patient denies any alleviating or aggravating factors.  She denies any fever.  At the ER she was found with tenderness to palpation in the right lower quadrant.  Stable vital signs, no fever.  She has significant leukocytosis.  CT scan of the abdomen and pelvis shows dilated appendix with fat stranding.  No sign of perforation.  I personally evaluated the images.  Surgery is consulted by Dr. Fernand in this context for evaluation and management of acute appendicitis.  PAST MEDICAL HISTORY (PMH):  Past Medical History:  Diagnosis Date   Anemia      PAST SURGICAL HISTORY (PSH):  Past Surgical History:  Procedure Laterality Date   BACK SURGERY  33 years old     MEDICATIONS:  Prior to Admission medications  Not on File     ALLERGIES:  Allergies[1]   SOCIAL HISTORY:  Social History   Socioeconomic History   Marital status: Single    Spouse name: Not on file   Number of children: Not on file   Years of education: Not on file   Highest education level: Not on file  Occupational History   Not on file  Tobacco Use   Smoking status: Former    Current packs/day: 0.50    Types: Cigarettes    Passive exposure: Current   Smokeless tobacco: Never  Vaping Use   Vaping status: Never Used  Substance and Sexual Activity   Alcohol use: No   Drug use: No   Sexual activity: Not Currently  Other Topics Concern   Not on file  Social History Narrative   Not on file   Social Drivers of Health   Tobacco Use: Medium Risk (06/18/2024)   Patient History    Smoking Tobacco Use: Former    Smokeless Tobacco Use: Never    Passive Exposure: Current  Physicist, Medical Strain: Low Risk  (06/15/2023)   [facility]   Overall Financial Resource Strain (CARDIA)    Difficulty of Paying Living Expenses: Not very hard  Food Insecurity: No Food Insecurity (06/18/2024)   Epic    Worried About Radiation Protection Practitioner of Food in the Last Year: Never true    Ran Out of Food in the Last Year: Never true  Transportation Needs: No Transportation Needs (06/18/2024)   Epic    Lack of Transportation (Medical): No    Lack of Transportation (Non-Medical): No  Physical Activity: Not on file  Stress: Not on file  Social Connections: Not on file  Intimate Partner Violence: Not At Risk (06/18/2024)   Epic    Fear of Current or Ex-Partner: No    Emotionally Abused: No    Physically Abused: No    Sexually Abused: No  Depression (PHQ2-9): Not on file  Alcohol Screen: Not on file  Housing: Low Risk (06/18/2024)   Epic    Unable to Pay for Housing in the Last Year: No    Number of Times Moved in the Last Year: 0    Homeless in the Last Year: No  Utilities: Not At Risk (06/18/2024)   Epic    Threatened with loss of utilities: No  Health Literacy: Not on file      FAMILY HISTORY:  History  reviewed. No pertinent family history.   REVIEW OF SYSTEMS:  Constitutional: denies weight loss, fever, chills, or sweats  Eyes: denies any other vision changes, history of eye injury  ENT: denies sore throat, hearing problems  Respiratory: denies shortness of breath, wheezing  Cardiovascular: denies chest pain, palpitations  Gastrointestinal: positive abdominal pain Genitourinary: denies burning with urination or urinary frequency Musculoskeletal: denies any other joint pains or cramps  Skin: denies any other rashes or skin discolorations  Neurological: denies any other headache, dizziness, weakness  Psychiatric: denies any other depression, anxiety   All other review of systems were negative   VITAL SIGNS:  Temp:  [97.5 F (36.4 C)-98.7 F (37.1 C)] 97.5 F (36.4 C) (12/15 0818) Pulse Rate:  [73-90] 78 (12/15  0818) Resp:  [16-18] 18 (12/15 0818) BP: (103-127)/(52-70) 117/61 (12/15 0818) SpO2:  [100 %] 100 % (12/15 0818) Weight:  [65.8 kg] 65.8 kg (12/15 0818)     Height: 5' 4 (162.6 cm) Weight: 65.8 kg BMI (Calculated): 24.88   INTAKE/OUTPUT:  This shift: Total I/O In: -  Out: 100 [Urine:100]  Last 2 shifts: @IOLAST2SHIFTS @   PHYSICAL EXAM:  Constitutional:  -- Normal body habitus  -- Awake, alert, and oriented x3  Eyes:  -- Pupils equally round and reactive to light  -- No scleral icterus  Ear, nose, and throat:  -- No jugular venous distension  Pulmonary:  -- No crackles  -- Equal breath sounds bilaterally -- Breathing non-labored at rest Cardiovascular:  -- S1, S2 present  -- No pericardial rubs Gastrointestinal:  -- Abdomen soft, tender to palpation in right lower quadrant, non-distended, no guarding or rebound tenderness Musculoskeletal and Integumentary:  -- Wounds or skin discoloration: None appreciated -- Extremities: B/L UE and LE FROM, hands and feet warm, no edema  Neurologic:  -- Motor function: intact and symmetric -- Sensation: intact and symmetric   Labs:     Latest Ref Rng & Units 06/17/2024    6:06 PM 09/02/2016    8:38 PM 09/15/2015    4:53 AM  CBC  WBC 4.0 - 10.5 K/uL 25.4  17.1  24.6   Hemoglobin 12.0 - 15.0 g/dL 87.0  87.4  88.7   Hematocrit 36.0 - 46.0 % 38.7  37.1  33.0   Platelets 150 - 400 K/uL 375  342  301       Latest Ref Rng & Units 06/17/2024    6:06 PM 09/02/2016    8:38 PM 09/14/2015    6:57 PM  CMP  Glucose 70 - 99 mg/dL 831  868  91   BUN 6 - 20 mg/dL 14  10  9    Creatinine 0.44 - 1.00 mg/dL 9.38  9.39  9.63   Sodium 135 - 145 mmol/L 139  137  133   Potassium 3.5 - 5.1 mmol/L 3.5  3.5  3.7   Chloride 98 - 111 mmol/L 102  103  105   CO2 22 - 32 mmol/L 22  26  20    Calcium 8.9 - 10.3 mg/dL 9.4  9.2  8.4   Total Protein 6.5 - 8.1 g/dL 7.6  8.3  7.1   Total Bilirubin 0.0 - 1.2 mg/dL 0.5  1.0  0.5   Alkaline Phos 38 - 126 U/L 82  75   138   AST 15 - 41 U/L 25  20  19    ALT 0 - 44 U/L 29  14  10      Imaging studies:  EXAM: CT  ABDOMEN AND PELVIS WITH CONTRAST 06/17/2024 10:45:29 PM   TECHNIQUE: CT of the abdomen and pelvis was performed with the administration of 100 mL of iohexol  (OMNIPAQUE ) 300 MG/ML solution. Multiplanar reformatted images are provided for review. Automated exposure control, iterative reconstruction, and/or weight-based adjustment of the mA/kV was utilized to reduce the radiation dose to as low as reasonably achievable.   COMPARISON: Ultrasound from earlier in the same day.   CLINICAL HISTORY: RLQ abd pain, leukocytosis, consider appendicitis.   FINDINGS:   LOWER CHEST: No acute abnormality.   LIVER: The liver is unremarkable.   GALLBLADDER AND BILE DUCTS: Gallbladder is unremarkable. No biliary ductal dilatation.   SPLEEN: No acute abnormality.   PANCREAS: No acute abnormality.   ADRENAL GLANDS: No acute abnormality.   KIDNEYS, URETERS AND BLADDER: No stones in the kidneys or ureters. No hydronephrosis. No perinephric or periureteral stranding. Urinary bladder is unremarkable.   GI AND BOWEL: Stomach demonstrates no acute abnormality. The appendix is mildly prominent, measuring up to 9.4 mm in transverse diameter, mild periappendiceal inflammatory changes are noted consistent with very early appendicitis. No evidence of rupture is seen. There is no bowel obstruction.   PERITONEUM AND RETROPERITONEUM: No ascites. No free air.   VASCULATURE: Aorta is normal in caliber.   LYMPH NODES: No lymphadenopathy.   REPRODUCTIVE ORGANS: IUD is noted within the uterus. No adnexal mass is seen.   BONES AND SOFT TISSUES: No acute osseous abnormality. No focal soft tissue abnormality.   IMPRESSION: 1. Very early appendicitis with mild periappendiceal inflammatory changes. No evidence of rupture.   Electronically signed by: Oneil Devonshire MD 06/17/2024 10:49 PM EST  RP Workstation: [id number]  Assessment/Plan:  33 y.o. female with acute appendicitis.  Patient with history, physical exam and images consistent with acute appendicitis. Patient oriented about diagnosis and surgical management as treatment. Patient oriented about goals of surgery and its risk including: bowel injury, infection, abscess, bleeding, leak from cecum, intestinal adhesions, bowel obstruction, fistula, injury to the ureter among others.  Patient understood and agreed to proceed with surgery. Will admit patient, already started on antibiotic therapy, will give IV hydration since patient is NPO and schedule to OR.   Lucas Petrin, MD     [1] No Known Allergies

## 2024-06-18 NOTE — Op Note (Signed)
 Pre-op Diagnosis: Acute appendicitis   Post op Diagnosis: Acute appenditicis  Procedure: Robotic assisted laparoscopic appendectomy.  Anesthesia: GETA  Surgeon: Lucas Sjogren, MD, FACS  Wound Classification: clean contaminated  Specimen: Appendix  Complications: None  Estimated Blood Loss: 3 mL   Indications: Patient is a 33 y.o. female  presented with above right lower quadrant pain. CT scan shows acute appendicitis.     FIndings: 1.  Suppurative appendix  2. No peri-appendiceal abscess or phlegmon 3. Normal anatomy 4. Adequate hemostasis.        Description of procedure: The patient was placed on the operating table in the supine position. General anesthesia was induced. A time-out was completed verifying correct patient, procedure, site, positioning, and implant(s) and/or special equipment prior to beginning this procedure. The abdomen was prepped and draped in the usual sterile fashion.   Palmer's point located and Veress needle was inserted.  After confirming 2 clicks and a positive saline drop test, gas insufflation was initiated until the abdominal pressure was measured at 15 mmHg.  Afterwards, the Veress needle was removed and a 8 mm port was placed in left upper quadrant area using Optiview technique.  After local was infused, 3 additional incision on the left abdominal wall were made 5 cm apart.  An 12 mm port and two other 8 mm ports were placed under direct visualization.  No injuries from trocar placements were noted.  The table was placed in the Trendelenburg position with the right side elevated.  With the use of Tip up grasper, fenestrated bipolar and monopolar scissors, an inflamed appendix was identified and elevated.  Window created at base of appendix in the mesentery.    The mesoappendix was divided with combination of bipolar energy and monopolar scissors.  The base of the appendix was ligated with 3-0 Stratafix.  The appendix was divided with  monopolar scissors.  A second layer of the 3-0 Stratafix was done over the appendiceal stump.  The appendiceal stump was examined and hemostasis noted. No other pathology was identified within pelvis. The 12 mm trocar removed and port site closed with PMI using 0 vicryl under direct vision. Remaining trocars were removed under direct vision. No bleeding was noted.The abdomen was allowed to collapse.  All skin incisions then closed with subcuticular sutures Monocryl 4-0.  Wounds then dressed with dermabond.  The patient tolerated the procedure well, awakened from anesthesia and was taken to the postanesthesia care unit in satisfactory condition.  Sponge count and instrument count correct at the end of the procedure.

## 2024-06-18 NOTE — Transfer of Care (Signed)
 Immediate Anesthesia Transfer of Care Note  Patient: Katie Walton  Procedure(s) Performed: APPENDECTOMY, ROBOT-ASSISTED, LAPAROSCOPIC (Pelvis)  Patient Location: PACU  Anesthesia Type:General  Level of Consciousness: awake, drowsy, and patient cooperative  Airway & Oxygen Therapy: Patient Spontanous Breathing and Patient connected to face mask oxygen  Post-op Assessment: Report given to RN and Post -op Vital signs reviewed and stable  Post vital signs: Reviewed and stable  Last Vitals:  Vitals Value Taken Time  BP 109/48 06/18/24 10:38  Temp    Pulse 76 06/18/24 10:42  Resp 24 06/18/24 10:42  SpO2 97 % 06/18/24 10:42  Vitals shown include unfiled device data.  Last Pain:  Vitals:   06/18/24 0818  TempSrc: Temporal  PainSc: 5          Complications: No notable events documented.

## 2024-06-18 NOTE — Plan of Care (Signed)
 Patient discharged home with mother. Discharge instructions, when to follow up, and prescriptions reviewed with patient. Meds to bed ordered and patient has prescriptions at the bedside.  Patient verbalized understanding. Patient will be escorted out by auxiliary.   Elyn Sharps, RN 06/18/24 @1511 

## 2024-06-18 NOTE — Discharge Summary (Signed)
°  Patient ID: Katie Walton MRN: [id number] DOB/AGE: 34/08/1990 34 y.o.  Admit date: 06/17/2024 Discharge date: 06/18/2024   Discharge Diagnoses:  Principal Problem:   Acute appendicitis with localized peritonitis   Procedures: Robotic assisted laparoscopic appendectomy  Hospital Course: Patient presented to the ER with right lower quadrant pain.  She was found with tenderness to palpation in the right lower quadrant.  Stable vital signs.  Labs show significant leukocytosis.  CT scan of the abdomen and pelvis shows acute appendicitis.  She was taken to the operating room for appendectomy.  She had a robotic assisted laparoscopic appendectomy.  She tolerated the procedure well.  She was reevaluated in her room.  Patient tolerating diet.  Expected soreness around the left upper abdomen.  Upon discussion with patient she would prefer to be discharged home today with oral pain medications.  No sign of complications.  Physical Exam Vitals reviewed.  Cardiovascular:     Rate and Rhythm: Normal rate and regular rhythm.  Pulmonary:     Effort: Pulmonary effort is normal.     Breath sounds: Normal breath sounds.  Abdominal:     General: Abdomen is flat. Bowel sounds are normal.     Palpations: Abdomen is soft.  Skin:    General: Skin is warm.     Capillary Refill: Capillary refill takes less than 2 seconds.  Neurological:     General: No focal deficit present.     Mental Status: She is alert and oriented to person, place, and time.      Consults: None  Disposition: Discharge disposition: 01-Home or Self Care       Discharge Instructions     Diet - low sodium heart healthy   Complete by: As directed    Increase activity slowly   Complete by: As directed       Allergies as of 06/18/2024   No Known Allergies      Medication List     TAKE these medications    HYDROcodone -acetaminophen  5-325 MG tablet Commonly known as: NORCO/VICODIN Take 1 tablet by mouth every 6  (six) hours as needed for up to 3 days.        Follow-up Information     Rodolph Romano, MD Follow up in 2 week(s).   Specialty: General Surgery Why: Follow up after appendectomy Contact information: [address] [place] [address] [phone]                 This discharge encounter was for 35-minute most of the time counseling the patient and coordinating plan of care.

## 2024-06-18 NOTE — Anesthesia Procedure Notes (Signed)
 Procedure Name: Intubation Date/Time: 06/18/2024 9:32 AM  Performed by: Ledora Duncan, CRNAPre-anesthesia Checklist: Patient identified, Emergency Drugs available, Suction available and Patient being monitored Patient Re-evaluated:Patient Re-evaluated prior to induction Oxygen Delivery Method: Circle system utilized Preoxygenation: Pre-oxygenation with 100% oxygen Induction Type: IV induction Ventilation: Mask ventilation without difficulty Laryngoscope Size: Glidescope and 3 Grade View: Grade I Tube type: Oral Number of attempts: 1 Airway Equipment and Method: Stylet Placement Confirmation: ETT inserted through vocal cords under direct vision, positive ETCO2 and breath sounds checked- equal and bilateral Secured at: 20 cm Tube secured with: Tape Dental Injury: Teeth and Oropharynx as per pre-operative assessment

## 2024-06-18 NOTE — Discharge Instructions (Addendum)

## 2024-06-18 NOTE — Anesthesia Postprocedure Evaluation (Signed)
 Anesthesia Post Note  Patient: Katie Walton  Procedure(s) Performed: APPENDECTOMY, ROBOT-ASSISTED, LAPAROSCOPIC (Pelvis)  Patient location during evaluation: PACU Anesthesia Type: General Level of consciousness: awake and alert Pain management: pain level controlled Vital Signs Assessment: post-procedure vital signs reviewed and stable Respiratory status: spontaneous breathing, nonlabored ventilation and respiratory function stable Cardiovascular status: blood pressure returned to baseline and stable Postop Assessment: no apparent nausea or vomiting Anesthetic complications: no   No notable events documented.   Last Vitals:  Vitals:   06/18/24 1130 06/18/24 1234  BP: (!) 103/53 123/64  Pulse: 84 61  Resp: 18 20  Temp: 36.7 C 36.9 C  SpO2: 100% 100%    Last Pain:  Vitals:   06/18/24 1245  TempSrc:   PainSc: 5                  Fairy POUR Rumaldo Difatta

## 2024-06-18 NOTE — Anesthesia Preprocedure Evaluation (Signed)
 Anesthesia Evaluation  Patient identified by MRN, date of birth, ID band Patient awake    Reviewed: Allergy & Precautions, NPO status , Patient's Chart, lab work & pertinent test results  History of Anesthesia Complications Negative for: history of anesthetic complications  Airway Mallampati: III  TM Distance: >3 FB Neck ROM: full    Dental  (+) Chipped, Poor Dentition   Pulmonary neg shortness of breath, Current Smoker and Patient abstained from smoking.   Pulmonary exam normal        Cardiovascular Exercise Tolerance: Good (-) angina (-) DOE negative cardio ROS Normal cardiovascular exam     Neuro/Psych negative neurological ROS  negative psych ROS   GI/Hepatic negative GI ROS,,,(+)     substance abuse  marijuana use  Endo/Other  negative endocrine ROS    Renal/GU      Musculoskeletal   Abdominal   Peds  Hematology negative hematology ROS (+)   Anesthesia Other Findings Past Medical History: No date: Anemia  Past Surgical History: 33 years old: BACK SURGERY  BMI    Body Mass Index: 24.89 kg/m      Reproductive/Obstetrics negative OB ROS                              Anesthesia Physical Anesthesia Plan  ASA: 2  Anesthesia Plan: General ETT   Post-op Pain Management:    Induction: Intravenous  PONV Risk Score and Plan: Ondansetron , Dexamethasone , Midazolam  and Treatment may vary due to age or medical condition  Airway Management Planned: Oral ETT  Additional Equipment:   Intra-op Plan:   Post-operative Plan: Extubation in OR  Informed Consent: I have reviewed the patients History and Physical, chart, labs and discussed the procedure including the risks, benefits and alternatives for the proposed anesthesia with the patient or authorized representative who has indicated his/her understanding and acceptance.     Dental Advisory Given  Plan Discussed with:  Anesthesiologist, CRNA and Surgeon  Anesthesia Plan Comments: (Patient consented for risks of anesthesia including but not limited to:  - adverse reactions to medications - damage to eyes, teeth, lips or other oral mucosa - nerve damage due to positioning  - sore throat or hoarseness - Damage to heart, brain, nerves, lungs, other parts of body or loss of life  Patient voiced understanding and assent.)        Anesthesia Quick Evaluation

## 2024-06-19 ENCOUNTER — Encounter: Payer: Self-pay | Admitting: General Surgery

## 2024-06-19 LAB — SURGICAL PATHOLOGY
